# Patient Record
Sex: Male | Born: 1990
Health system: Southern US, Community
[De-identification: ages and names within clinical notes are randomized; demographics above are authoritative.]

## PROBLEM LIST (undated history)

## (undated) DIAGNOSIS — M041 Periodic fever syndromes: Secondary | ICD-10-CM

## (undated) DIAGNOSIS — M0890 Juvenile arthritis, unspecified, unspecified site: Secondary | ICD-10-CM

## (undated) DIAGNOSIS — J45909 Unspecified asthma, uncomplicated: Secondary | ICD-10-CM

## (undated) HISTORY — PX: EXTERNAL EAR SURGERY: SHX627

## (undated) HISTORY — PX: INNER EAR SURGERY: SHX679

## (undated) HISTORY — DX: Periodic fever syndromes: M04.1

## (undated) HISTORY — PX: WISDOM TOOTH EXTRACTION: SHX21

---
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
## 2010-03-02 ENCOUNTER — Emergency Department (HOSPITAL_COMMUNITY): Admission: EM | Admit: 2010-03-02 | Discharge: 2010-03-02 | Payer: Self-pay | Admitting: Emergency Medicine

## 2010-03-28 ENCOUNTER — Emergency Department (HOSPITAL_COMMUNITY): Admission: EM | Admit: 2010-03-28 | Discharge: 2010-03-07 | Payer: Self-pay | Admitting: Emergency Medicine

## 2010-04-19 ENCOUNTER — Emergency Department (HOSPITAL_COMMUNITY)
Admission: EM | Admit: 2010-04-19 | Discharge: 2010-04-19 | Payer: Self-pay | Source: Home / Self Care | Admitting: Emergency Medicine

## 2010-04-23 ENCOUNTER — Emergency Department (HOSPITAL_COMMUNITY)
Admission: EM | Admit: 2010-04-23 | Discharge: 2010-04-23 | Payer: Self-pay | Source: Home / Self Care | Admitting: Emergency Medicine

## 2010-05-12 ENCOUNTER — Encounter: Payer: Self-pay | Admitting: Pediatrics

## 2010-09-06 NOTE — Procedures (Signed)
EEG NUMBER:  [id number].   PERFORMED AND DICTATED:  July 10, 2005.   CLINICAL HISTORY:  The patient is a 20 year old male with periodic episodes  of fever with temperatures 101-103. He complains of frontal pressure-like  headaches and has episodes of memory loss. Study is being done to look for  the presence of a seizure disorder.   PROCEDURE:  The tracing is carried out on a 32-channel digital Cadwell  recorder reformatted into 16-channel montages with one devoted to EKG. The  patient was awake and asleep during the recording. The [employer] 10/20  system lead placement was used.   DESCRIPTION OF FINDINGS:  Dominant frequency is an 8-10 Hz 20-25 microvolt  activity that is well regulated and attenuates partially with eye opening.   Background activity is a mixture of mixed frequency theta and frontally  predominant beta range activity.   The patient becomes drowsy with mixed frequency theta and delta range  components and loss of the dominant frequency. The patient drifts briefly  into natural sleep with vertex sharp waves and fragmentary sleep spindles  and it is desynchronized delta background.   The patient is aroused and the waking record returns. Intermittent photic  stimulation induced a sustained symmetric driving response. Hyperventilation  caused little change. There was no interictal epileptiform activity in the  form of spikes or sharp waves.   EKG showed a regular sinus rhythm with ventricular response of 72 beats per  minute.   IMPRESSION:  Normal record awake and asleep.      Deanna Artis. Sharene Skeans, M.D.  Electronically Signed     ZOX:[id number]  D:  07/10/2005 15:57:33  T:  07/11/2005 16:53:52  Job #:  [id number]   cc:   Jefferey Pica, MD

## 2011-01-22 ENCOUNTER — Emergency Department (HOSPITAL_COMMUNITY)
Admission: EM | Admit: 2011-01-22 | Discharge: 2011-01-23 | Disposition: A | Discharge status: Home / Self Care | Payer: Medicaid Other | Attending: Emergency Medicine | Admitting: Emergency Medicine

## 2011-01-22 DIAGNOSIS — R10819 Abdominal tenderness, unspecified site: Secondary | ICD-10-CM | POA: Insufficient documentation

## 2011-01-22 DIAGNOSIS — R51 Headache: Secondary | ICD-10-CM | POA: Insufficient documentation

## 2011-05-08 ENCOUNTER — Emergency Department (HOSPITAL_COMMUNITY)
Admission: EM | Admit: 2011-05-08 | Discharge: 2011-05-08 | Disposition: A | Discharge status: Home / Self Care | Payer: Medicaid Other | Attending: Emergency Medicine | Admitting: Emergency Medicine

## 2011-05-08 ENCOUNTER — Encounter (HOSPITAL_COMMUNITY): Payer: Self-pay | Admitting: *Deleted

## 2011-05-08 DIAGNOSIS — R5383 Other fatigue: Secondary | ICD-10-CM | POA: Insufficient documentation

## 2011-05-08 DIAGNOSIS — R000 Tachycardia, unspecified: Secondary | ICD-10-CM | POA: Insufficient documentation

## 2011-05-08 DIAGNOSIS — E876 Hypokalemia: Secondary | ICD-10-CM | POA: Insufficient documentation

## 2011-05-08 DIAGNOSIS — R5381 Other malaise: Secondary | ICD-10-CM | POA: Insufficient documentation

## 2011-05-08 DIAGNOSIS — K529 Noninfective gastroenteritis and colitis, unspecified: Secondary | ICD-10-CM

## 2011-05-08 DIAGNOSIS — R509 Fever, unspecified: Secondary | ICD-10-CM | POA: Insufficient documentation

## 2011-05-08 DIAGNOSIS — K5289 Other specified noninfective gastroenteritis and colitis: Secondary | ICD-10-CM | POA: Insufficient documentation

## 2011-05-08 HISTORY — DX: Periodic fever syndromes: M04.1

## 2011-05-08 HISTORY — DX: Juvenile arthritis, unspecified, unspecified site: M08.90

## 2011-05-08 LAB — COMPREHENSIVE METABOLIC PANEL
Albumin: 3.8 g/dL (ref 3.5–5.2)
BUN: 9 mg/dL (ref 6–23)
Creatinine, Ser: 0.81 mg/dL (ref 0.50–1.35)
GFR calc Af Amer: >90 mL/min (ref >90)
Glucose, Bld: 108 mg/dL — ABNORMAL HIGH (ref 70–99)
Total Bilirubin: 1.3 mg/dL — ABNORMAL HIGH (ref 0.3–1.2)
Total Protein: 8 g/dL (ref 6.0–8.3)

## 2011-05-08 LAB — CBC
HCT: 40.8 % (ref 39.0–52.0)
MCV: 79.8 fL (ref 78.0–100.0)
Platelets: 172 10*3/uL (ref 150–400)
RBC: 5.11 MIL/uL (ref 4.22–5.81)
RDW: 15 % (ref 11.5–15.5)
WBC: 8.3 10*3/uL (ref 4.0–10.5)

## 2011-05-08 LAB — DIFFERENTIAL
Basophils Absolute: 0 10*3/uL (ref 0.0–0.1)
Basophils Relative: 0 % (ref 0–1)
Eosinophils Absolute: 0 10*3/uL (ref 0.0–0.7)
Eosinophils Relative: 0 % (ref 0–5)
Lymphocytes Relative: 6 % — ABNORMAL LOW (ref 12–46)
Lymphs Abs: 0.5 10*3/uL — ABNORMAL LOW (ref 0.7–4.0)
Monocytes Absolute: 0.5 10*3/uL (ref 0.1–1.0)
Monocytes Relative: 6 % (ref 3–12)
Neutro Abs: 7.3 10*3/uL (ref 1.7–7.7)
Neutrophils Relative %: 88 % — ABNORMAL HIGH (ref 43–77)

## 2011-05-08 MED ORDER — SODIUM CHLORIDE 0.9 % IV BOLUS (SEPSIS)
1000.0000 mL | Freq: Once | INTRAVENOUS | Status: AC
  Administered 2011-05-08: 1000 mL via INTRAVENOUS

## 2011-05-08 MED ORDER — POTASSIUM CHLORIDE CRYS ER 20 MEQ PO TBCR
40.0000 meq | EXTENDED_RELEASE_TABLET | Freq: Once | ORAL | Status: AC
  Administered 2011-05-08: 40 meq via ORAL
  Filled 2011-05-08: qty 2

## 2011-05-08 MED ORDER — LOPERAMIDE HCL 2 MG PO CAPS: 2.0000 mg | ORAL_CAPSULE | Freq: Four times a day (QID) | ORAL | Status: AC | PRN

## 2011-05-08 MED ORDER — KETOROLAC TROMETHAMINE 30 MG/ML IJ SOLN
30.0000 mg | Freq: Once | INTRAMUSCULAR | Status: AC
  Administered 2011-05-08: 30 mg via INTRAVENOUS
  Filled 2011-05-08: qty 1

## 2011-05-08 MED ORDER — PROMETHAZINE HCL 25 MG PO TABS: 25.0000 mg | ORAL_TABLET | Freq: Four times a day (QID) | ORAL | Status: AC | PRN

## 2011-05-08 MED ORDER — ONDANSETRON HCL 4 MG/2ML IJ SOLN
4.0000 mg | Freq: Once | INTRAMUSCULAR | Status: AC
  Administered 2011-05-08: 4 mg via INTRAVENOUS
  Filled 2011-05-08: qty 2

## 2011-05-08 NOTE — ED Notes (Signed)
Pt states "I've been tx'd everywhere, have HID, started having bloody diarrhea & vomiting blood yesterday"

## 2011-05-08 NOTE — ED Provider Notes (Signed)
History     CSN: [id number]  Arrival date & time 05/08/11  1311   First MD Initiated Contact with Patient 05/08/11 1506      Chief Complaint  Patient presents with  . Diarrhea    bloody - Hx of HID    (Consider location/radiation/quality/duration/timing/severity/associated sxs/prior treatment) Patient is a 21 y.o. male presenting with diarrhea. The history is provided by the patient.  Diarrhea The primary symptoms include fever, abdominal pain, nausea, vomiting and diarrhea. Primary symptoms do not include rash.  The illness does not include chills.   the   patient is a 21 year old male, with a history of periodic, fever.  Syndrome, and HIDS , for which he takes steroids, and Diclofenac.  He is followed for his disease at [facility].  He complains of nausea, vomiting, diarrhea, and generalized abdominal pain since yesterday.  He has felt like he said a fever, as well.  He says his infant son had similar symptoms about 2 weeks ago.  He has not been taking antibiotics recently.  He states that there is blood in his emesis and stool.  He denies respiratory symptoms.  He says usually when this occurs.  He comes in the ER and gets Flumadine and is released.  Past Medical History  Diagnosis Date  . Periodic fever syndrome   . Juvenile arthritis     History reviewed. No pertinent past surgical history.  No family history on file.  History  Substance Use Topics  . Smoking status: Former Games developer  . Smokeless tobacco: Not on file  . Alcohol Use: No      Review of Systems  Constitutional: Positive for fever. Negative for chills.  Respiratory: Negative for cough and shortness of breath.   Gastrointestinal: Positive for nausea, vomiting, abdominal pain, diarrhea and blood in stool.  Skin: Negative for rash.  Neurological: Positive for weakness. Negative for headaches.  All other systems reviewed and are negative.    Allergies  Codeine  Home Medications   Current  Outpatient Rx  Name Route Sig Dispense Refill  . ACETAMINOPHEN 500 MG PO TABS Oral Take 1,000 mg by mouth every 6 (six) hours as needed. For pain    . DICLOFENAC SODIUM 75 MG PO TBEC Oral Take 75 mg by mouth 2 (two) times daily.    . IBUPROFEN 800 MG PO TABS Oral Take 800 mg by mouth every 8 (eight) hours as needed. For pain      BP 148/135  Pulse 103  Temp(Src) 99.5 F (37.5 C) (Oral)  Resp 19  Wt 220 lb (99.791 kg)  SpO2 100%  Physical Exam  Vitals reviewed. Constitutional: He is oriented to person, place, and time. He appears well-developed and well-nourished. No distress.  HENT:  Head: Normocephalic and atraumatic.  Eyes: EOM are normal. Pupils are equal, round, and reactive to light.  Neck: Normal range of motion. Neck supple.  Cardiovascular: Regular rhythm and normal heart sounds.   No murmur heard.      Tachycardia  Pulmonary/Chest: Effort normal and breath sounds normal. No respiratory distress. He has no wheezes. He has no rales.  Abdominal: Soft. Bowel sounds are normal. He exhibits no distension and no mass. There is no tenderness. There is no rebound and no guarding.  Musculoskeletal: Normal range of motion. He exhibits no edema and no tenderness.  Neurological: He is alert and oriented to person, place, and time. No cranial nerve deficit.  Skin: Skin is warm and dry. He is not diaphoretic.  Psychiatric: He has a normal mood and affect. His behavior is normal.    ED Course  Procedures (including critical care time) 21 year old, male, complains of symptoms consistent with gastroenteritis.  He is mildly tachycardic.  Helistat was diabetes basic laboratory testing and treat his symptoms.  He does not have an acute abdomen likely.  He will be able to go home.  After treatment   Labs Reviewed  COMPREHENSIVE METABOLIC PANEL  CBC  DIFFERENTIAL   No results found.   No diagnosis found.  Feels better. Eating. No vomiting in ed.  MDM  Gastroenteritis - asx after  ed tx Hypokalemia - k replaced in ed. N/v controlled.  So etio resolved       Nicholes Stairs, MD 05/08/11 [phone]

## 2012-01-04 ENCOUNTER — Emergency Department (HOSPITAL_COMMUNITY)
Admission: EM | Admit: 2012-01-04 | Discharge: 2012-01-04 | Disposition: A | Discharge status: Home / Self Care | Payer: Medicare Other | Attending: Emergency Medicine | Admitting: Emergency Medicine

## 2012-01-04 ENCOUNTER — Encounter (HOSPITAL_COMMUNITY): Payer: Self-pay | Admitting: Emergency Medicine

## 2012-01-04 ENCOUNTER — Ambulatory Visit: Payer: Medicaid Other

## 2012-01-04 DIAGNOSIS — K645 Perianal venous thrombosis: Secondary | ICD-10-CM | POA: Insufficient documentation

## 2012-01-04 DIAGNOSIS — Z87891 Personal history of nicotine dependence: Secondary | ICD-10-CM | POA: Insufficient documentation

## 2012-01-04 MED ORDER — HYDROCORTISONE 2.5 % RE CREA: TOPICAL_CREAM | RECTAL | Status: AC

## 2012-01-04 MED ORDER — ACETAMINOPHEN 325 MG PO TABS
650.0000 mg | ORAL_TABLET | Freq: Once | ORAL | Status: AC
  Administered 2012-01-04: 650 mg via ORAL
  Filled 2012-01-04: qty 2

## 2012-01-04 NOTE — ED Notes (Signed)
Patient c/o of a left sided rectal hemorrhoid. Patient states that he noticed it ruptured while he was playing with his daughter. There was some bloody drainage.  Patient states that drainage soak through about 10 baby wipes. Patient states that he felt immediate relief after the rupture.  Patient is no other distress.

## 2012-01-04 NOTE — ED Provider Notes (Signed)
History     CSN: [id number]  Arrival date & time 01/04/12  1544   First MD Initiated Contact with Patient 01/04/12 1624      Chief Complaint  Patient presents with  . Hemorrhoids    (Consider location/radiation/quality/duration/timing/severity/associated sxs/prior treatment) HPI  21 year old male presents for evaluations of rectal pain. Patient reports he notice a rectal hemorrhoid for the past several days.  Today he noticed that the hemorrhoid ruptured.  Patient felt immediate relief after the rupture. He noticed bloody drainage, soaked through 10 baby wipes. Not on any blood thinner.  Does admits to working a job where he sits often.  Denies any recent trauma.  Denies abd pain, fever, or rash.     Past Medical History  Diagnosis Date  . Periodic fever syndrome   . Juvenile arthritis     Past Surgical History  Procedure Date  . External ear surgery     No family history on file.  History  Substance Use Topics  . Smoking status: Former Smoker    Types: Cigarettes  . Smokeless tobacco: Not on file  . Alcohol Use: No      Review of Systems  Constitutional: Negative for fever.  Gastrointestinal: Negative for abdominal pain.  Hematological: Does not bruise/bleed easily.  All other systems reviewed and are negative.    Allergies  Codeine  Home Medications   Current Outpatient Rx  Name Route Sig Dispense Refill  . ACETAMINOPHEN 500 MG PO TABS Oral Take 1,000 mg by mouth every 6 (six) hours as needed. For pain      BP 133/72  Pulse 110  Resp 18  Ht 6' (1.829 m)  Wt 202 lb (91.627 kg)  BMI 27.40 kg/m2  SpO2 97%  Physical Exam  Nursing note and vitals reviewed. Constitutional: He appears well-developed and well-nourished. No distress.  HENT:  Head: Atraumatic.  Eyes: Conjunctivae normal are normal.  Neck: Normal range of motion. Neck supple.  Abdominal: There is no tenderness.  Genitourinary:       Rectum: thrombosed hemorrhoid, actively oozing,  ttp.  No perirectal abscess.  No anal fissure.  No fb.    ED Course  Procedures (including critical care time)  Labs Reviewed - No data to display No results found.   No diagnosis found.  1. Thrombosed hemorrhoid  MDM  Pt has thrombosed hemorrhoid with spontaneous rupture.  Not on any anticoagulant.  Will prescribe anusol and care instruction including sitz bath, using stool softener, and eat light diet.  Pt voice understanding and agrees with plan.    BP 133/72  Pulse 110  Temp 98.8 F (37.1 C) (Oral)  Resp 18  Ht 6' (1.829 m)  Wt 202 lb (91.627 kg)  BMI 27.40 kg/m2  SpO2 97%  Nursing notes reviewed and considered in documentation         Fayrene Helper, PA-C 01/04/12 1650

## 2012-01-04 NOTE — ED Provider Notes (Signed)
Medical screening examination/treatment/procedure(s) were performed by non-physician practitioner and as supervising physician I was immediately available for consultation/collaboration.  Alison Breeding, MD 01/04/12 2319 

## 2012-02-27 ENCOUNTER — Emergency Department (HOSPITAL_COMMUNITY)
Admission: EM | Admit: 2012-02-27 | Discharge: 2012-02-27 | Disposition: A | Discharge status: Home / Self Care | Payer: Medicare Other | Attending: Emergency Medicine | Admitting: Emergency Medicine

## 2012-02-27 ENCOUNTER — Encounter (HOSPITAL_COMMUNITY): Payer: Self-pay | Admitting: *Deleted

## 2012-02-27 DIAGNOSIS — M083 Juvenile rheumatoid polyarthritis (seronegative): Secondary | ICD-10-CM | POA: Insufficient documentation

## 2012-02-27 DIAGNOSIS — Z862 Personal history of diseases of the blood and blood-forming organs and certain disorders involving the immune mechanism: Secondary | ICD-10-CM | POA: Insufficient documentation

## 2012-02-27 DIAGNOSIS — R05 Cough: Secondary | ICD-10-CM | POA: Insufficient documentation

## 2012-02-27 DIAGNOSIS — Z87891 Personal history of nicotine dependence: Secondary | ICD-10-CM | POA: Insufficient documentation

## 2012-02-27 DIAGNOSIS — J45909 Unspecified asthma, uncomplicated: Secondary | ICD-10-CM | POA: Insufficient documentation

## 2012-02-27 DIAGNOSIS — R059 Cough, unspecified: Secondary | ICD-10-CM | POA: Insufficient documentation

## 2012-02-27 DIAGNOSIS — Z8639 Personal history of other endocrine, nutritional and metabolic disease: Secondary | ICD-10-CM | POA: Insufficient documentation

## 2012-02-27 MED ORDER — ALBUTEROL SULFATE (5 MG/ML) 0.5% IN NEBU
5.0000 mg | INHALATION_SOLUTION | Freq: Once | RESPIRATORY_TRACT | Status: AC
  Administered 2012-02-27: 5 mg via RESPIRATORY_TRACT
  Filled 2012-02-27: qty 1

## 2012-02-27 MED ORDER — ALBUTEROL SULFATE HFA 108 (90 BASE) MCG/ACT IN AERS
2.0000 | INHALATION_SPRAY | RESPIRATORY_TRACT | Status: DC | PRN
  Administered 2012-02-27: 2 via RESPIRATORY_TRACT
  Filled 2012-02-27: qty 6.7

## 2012-02-27 MED ORDER — IPRATROPIUM BROMIDE 0.02 % IN SOLN
0.5000 mg | Freq: Once | RESPIRATORY_TRACT | Status: AC
  Administered 2012-02-27: 0.5 mg via RESPIRATORY_TRACT
  Filled 2012-02-27: qty 2.5

## 2012-02-27 NOTE — ED Provider Notes (Signed)
Medical screening examination/treatment/procedure(s) were performed by non-physician practitioner and as supervising physician I was immediately available for consultation/collaboration.   Tyrik Stetzer H Cathe Bilger, MD 02/27/12 1558 

## 2012-02-27 NOTE — ED Notes (Signed)
Pt reports SOB and non-productive cough this am.  Pt reports hx of asthma.  Pt also requesting albuterol rx.  Pt reports chest pain when coughing.

## 2012-02-27 NOTE — ED Provider Notes (Signed)
History     CSN: [id number]  Arrival date & time 02/27/12  1019   First MD Initiated Contact with Patient 02/27/12 1024      Chief Complaint  Patient presents with  . Shortness of Breath    (Consider location/radiation/quality/duration/timing/severity/associated sxs/prior treatment) HPI Comments: Patient with history of asthma presents after having an asthma attack this morning. Patient states that he felt short of breath for approximately 90 minutes until improvement spontaneously. Patient states that he is out of his rescue inhaler. He denies upper respiratory tract infection symptoms. Patient had coughing with asthma attack. No fevers. No other treatments. Onset acute. Course is gradually improving. Nothing makes symptoms better worse.  The history is provided by the patient.    Past Medical History  Diagnosis Date  . Periodic fever syndrome   . Juvenile arthritis     Past Surgical History  Procedure Date  . External ear surgery     No family history on file.  History  Substance Use Topics  . Smoking status: Former Smoker    Types: Cigarettes  . Smokeless tobacco: Not on file  . Alcohol Use: No      Review of Systems  Constitutional: Negative for fever.  HENT: Negative for sore throat and rhinorrhea.   Eyes: Negative for redness.  Respiratory: Positive for cough, chest tightness, shortness of breath and wheezing.   Cardiovascular: Negative for chest pain.  Gastrointestinal: Negative for nausea, vomiting, abdominal pain and diarrhea.  Genitourinary: Negative for dysuria.  Musculoskeletal: Negative for myalgias.  Skin: Negative for rash.  Neurological: Negative for headaches.    Allergies  Codeine  Home Medications   Current Outpatient Rx  Name  Route  Sig  Dispense  Refill  . ACETAMINOPHEN 500 MG PO TABS   Oral   Take by mouth every 6 (six) hours as needed. For pain         . ALBUTEROL SULFATE HFA 108 (90 BASE) MCG/ACT IN AERS   Inhalation  Inhale 2 puffs into the lungs every 6 (six) hours as needed. Breathing         . IBUPROFEN 200 MG PO TABS   Oral   Take 400 mg by mouth every 6 (six) hours as needed. Pain           BP 121/74  Pulse 64  Temp 97.8 F (36.6 C) (Oral)  Resp 20  SpO2 98%  Physical Exam  Nursing note and vitals reviewed. Constitutional: He appears well-developed and well-nourished.  HENT:  Head: Normocephalic and atraumatic.  Eyes: Conjunctivae normal are normal. Right eye exhibits no discharge. Left eye exhibits no discharge.  Neck: Normal range of motion. Neck supple.  Cardiovascular: Normal rate, regular rhythm and normal heart sounds.   Pulmonary/Chest: Effort normal. He has wheezes (expiratory, mild, all fields).  Abdominal: Soft. There is no tenderness.  Neurological: He is alert.  Skin: Skin is warm and dry.  Psychiatric: He has a normal mood and affect.    ED Course  Procedures (including critical care time)  Labs Reviewed - No data to display No results found.   1. Asthma     10:44 AM Patient seen and examined. Medications ordered.   Vital signs reviewed and are as follows: Filed Vitals:   02/27/12 1043  BP: 121/74  Pulse: 64  Temp: 97.8 F (36.6 C)  Resp: 20   12:24 PM Wheezing resolved after treatment. Will d/c to home with rescue inhaler.   Patient counseled on use of  albuterol HFA.  Told to use 1-2 puffs q 4 hours as needed for SOB.  Patient urged to return with worsening symptoms or other concerns. Patient verbalized understanding and agrees with plan.    MDM  Asthma attack -- improved in ED with treatment. Pt appears well before and after. Inhaler provided, urged pt to find PCP, referrals given.         Renne Crigler, [place] 02/27/12 1226

## 2012-07-12 ENCOUNTER — Emergency Department (HOSPITAL_COMMUNITY)
Admission: EM | Admit: 2012-07-12 | Discharge: 2012-07-12 | Disposition: A | Discharge status: Home / Self Care | Payer: Medicare Other | Attending: Emergency Medicine | Admitting: Emergency Medicine

## 2012-07-12 ENCOUNTER — Encounter (HOSPITAL_COMMUNITY): Payer: Self-pay | Admitting: *Deleted

## 2012-07-12 DIAGNOSIS — M19029 Primary osteoarthritis, unspecified elbow: Secondary | ICD-10-CM | POA: Insufficient documentation

## 2012-07-12 DIAGNOSIS — M1990 Unspecified osteoarthritis, unspecified site: Secondary | ICD-10-CM

## 2012-07-12 DIAGNOSIS — Z87891 Personal history of nicotine dependence: Secondary | ICD-10-CM | POA: Insufficient documentation

## 2012-07-12 DIAGNOSIS — Z79899 Other long term (current) drug therapy: Secondary | ICD-10-CM | POA: Insufficient documentation

## 2012-07-12 MED ORDER — IBUPROFEN 800 MG PO TABS: 800.0000 mg | ORAL_TABLET | Freq: Three times a day (TID) | ORAL | Status: DC

## 2012-07-12 MED ORDER — IBUPROFEN 800 MG PO TABS
800.0000 mg | ORAL_TABLET | Freq: Once | ORAL | Status: AC
  Administered 2012-07-12: 800 mg via ORAL
  Filled 2012-07-12: qty 1

## 2012-07-12 NOTE — ED Provider Notes (Signed)
Medical screening examination/treatment/procedure(s) were performed by non-physician practitioner and as supervising physician I was immediately available for consultation/collaboration.   Lyanne Co, MD 07/12/12 [phone]

## 2012-07-12 NOTE — ED Notes (Signed)
Pt reports hx of arthritis.  Reports waking up with pain in his R elbow this am.  States that he normally takes a bigger dose of motrin whenever his arthritis flares up.  No swelling noted at this time.

## 2012-07-12 NOTE — ED Provider Notes (Signed)
History     CSN: [id number]  Arrival date & time 07/12/12  1318   First MD Initiated Contact with Patient 07/12/12 1420      Chief Complaint  Patient presents with  . Joint Pain    R elbow    (Consider location/radiation/quality/duration/timing/severity/associated sxs/prior treatment) HPI Comments: This is a 22 year old male, past medical history remarkable for juvenile arthritis, who presents emergency department with chief complaint of right elbow pain. Patient states that this is not new to him. He states that this is how his arthritis reacts. He states that he normally takes high-dose ibuprofen when this happens. The ibuprofen will help the elbow pain. Patient is requesting ibuprofen 800 in the emergency department. The pain does not radiate. States pain is moderate.  The history is provided by the patient. No language interpreter was used.    Past Medical History  Diagnosis Date  . Periodic fever syndrome   . Juvenile arthritis     Past Surgical History  Procedure Laterality Date  . External ear surgery      No family history on file.  History  Substance Use Topics  . Smoking status: Former Smoker    Types: Cigarettes  . Smokeless tobacco: Not on file  . Alcohol Use: No      Review of Systems  All other systems reviewed and are negative.    Allergies  Codeine  Home Medications   Current Outpatient Rx  Name  Route  Sig  Dispense  Refill  . acetaminophen (TYLENOL) 500 MG tablet   Oral   Take by mouth every 6 (six) hours as needed. For pain         . albuterol (PROVENTIL HFA;VENTOLIN HFA) 108 (90 BASE) MCG/ACT inhaler   Inhalation   Inhale 2 puffs into the lungs every 6 (six) hours as needed. Breathing         . ibuprofen (ADVIL,MOTRIN) 200 MG tablet   Oral   Take 400 mg by mouth every 6 (six) hours as needed. Pain           BP 127/74  Pulse 63  Temp(Src) 98 F (36.7 C) (Oral)  Resp 18  SpO2 100%  Physical Exam  Nursing note and  vitals reviewed. Constitutional: He is oriented to person, place, and time. He appears well-developed and well-nourished.  HENT:  Head: Normocephalic and atraumatic.  Eyes: Conjunctivae and EOM are normal.  Neck: Normal range of motion.  Cardiovascular: Normal rate.   Pulmonary/Chest: Effort normal.  Abdominal: He exhibits no distension.  Musculoskeletal: Normal range of motion. He exhibits tenderness.  Soft tissue tenderness in the right elbow, no signs of infection, or cellulitis  Neurological: He is alert and oriented to person, place, and time.  Skin: Skin is dry.  Psychiatric: He has a normal mood and affect. His behavior is normal. Judgment and thought content normal.    ED Course  Procedures (including critical care time)  Labs Reviewed - No data to display No results found.   1. Arthritis       MDM  22 year old male with arthritis. Patient states that this is not new. He states that he gets elbow pain on and off, that it is easily treated with ibuprofen. He is requesting ibuprofen at this time. I will fill his ibuprofen, and have him followup with rheumatology. Patient understands and agrees with the plan. He is stable and ready for discharge.       Roxy Horseman, PA-C 07/12/12 1515

## 2012-07-31 ENCOUNTER — Emergency Department (HOSPITAL_COMMUNITY)
Admission: EM | Admit: 2012-07-31 | Discharge: 2012-08-01 | Disposition: A | Discharge status: Home / Self Care | Payer: Medicare Other | Attending: Emergency Medicine | Admitting: Emergency Medicine

## 2012-07-31 ENCOUNTER — Encounter (HOSPITAL_COMMUNITY): Payer: Self-pay | Admitting: *Deleted

## 2012-07-31 DIAGNOSIS — J45909 Unspecified asthma, uncomplicated: Secondary | ICD-10-CM | POA: Insufficient documentation

## 2012-07-31 DIAGNOSIS — R509 Fever, unspecified: Secondary | ICD-10-CM

## 2012-07-31 DIAGNOSIS — M7989 Other specified soft tissue disorders: Secondary | ICD-10-CM | POA: Insufficient documentation

## 2012-07-31 DIAGNOSIS — Z8739 Personal history of other diseases of the musculoskeletal system and connective tissue: Secondary | ICD-10-CM | POA: Insufficient documentation

## 2012-07-31 DIAGNOSIS — R51 Headache: Secondary | ICD-10-CM | POA: Insufficient documentation

## 2012-07-31 DIAGNOSIS — Z79899 Other long term (current) drug therapy: Secondary | ICD-10-CM | POA: Insufficient documentation

## 2012-07-31 DIAGNOSIS — R112 Nausea with vomiting, unspecified: Secondary | ICD-10-CM | POA: Insufficient documentation

## 2012-07-31 DIAGNOSIS — M791 Myalgia, unspecified site: Secondary | ICD-10-CM

## 2012-07-31 DIAGNOSIS — Z87891 Personal history of nicotine dependence: Secondary | ICD-10-CM | POA: Insufficient documentation

## 2012-07-31 DIAGNOSIS — R109 Unspecified abdominal pain: Secondary | ICD-10-CM | POA: Insufficient documentation

## 2012-07-31 DIAGNOSIS — IMO0001 Reserved for inherently not codable concepts without codable children: Secondary | ICD-10-CM | POA: Insufficient documentation

## 2012-07-31 HISTORY — DX: Unspecified asthma, uncomplicated: J45.909

## 2012-07-31 LAB — POCT I-STAT, CHEM 8
BUN: 9 mg/dL (ref 6–23)
Chloride: 97 mEq/L (ref 96–112)
Creatinine, Ser: 1.1 mg/dL (ref 0.50–1.35)
Sodium: 140 mEq/L (ref 135–145)
TCO2: 33 mmol/L (ref 0–100)

## 2012-07-31 MED ORDER — ONDANSETRON HCL 4 MG/2ML IJ SOLN
4.0000 mg | Freq: Once | INTRAMUSCULAR | Status: AC
  Administered 2012-07-31: 4 mg via INTRAVENOUS
  Filled 2012-07-31: qty 2

## 2012-07-31 MED ORDER — SODIUM CHLORIDE 0.9 % IV BOLUS (SEPSIS)
1000.0000 mL | Freq: Once | INTRAVENOUS | Status: AC
  Administered 2012-07-31: 1000 mL via INTRAVENOUS

## 2012-07-31 MED ORDER — MORPHINE SULFATE 4 MG/ML IJ SOLN
4.0000 mg | Freq: Once | INTRAMUSCULAR | Status: AC
  Administered 2012-07-31: 4 mg via INTRAVENOUS
  Filled 2012-07-31: qty 1

## 2012-07-31 NOTE — ED Notes (Signed)
Pt states he has had generalized body aches and pains with N/V/D since Monday, April 7.  Pt has HIDS, periodic fever syndrome.  Pt has not seen a doctor regarding this episode and does not currently have a PCP.

## 2012-07-31 NOTE — ED Provider Notes (Signed)
History     CSN: [id number]  Arrival date & time 07/31/12  2119   First MD Initiated Contact with Patient 07/31/12 2205      Chief Complaint  Patient presents with  . Emesis  . Generalized Body Aches  . Diarrhea    (Consider location/radiation/quality/duration/timing/severity/associated sxs/prior treatment) HPI Comments: Patient with Hx  JRA and HIDS ( Hyperimmunoglobulin D Syndrome) now with generalized body pain for 6 days   Patient is a 22 y.o. male presenting with vomiting and diarrhea. The history is provided by the patient.  Emesis Severity:  Moderate Duration:  6 days Timing:  Constant Quality:  Bilious material Able to tolerate:  Liquids How soon after eating does vomiting occur:  30 minutes Progression:  Worsening Chronicity:  Recurrent Recent urination:  Normal Relieved by:  Nothing Ineffective treatments:  None tried Associated symptoms: abdominal pain, arthralgias, chills, diarrhea, fever, headaches and myalgias   Diarrhea Associated symptoms: abdominal pain, arthralgias, chills, fever, headaches, myalgias and vomiting     Past Medical History  Diagnosis Date  . Periodic fever syndrome   . Juvenile arthritis   . Asthma     Past Surgical History  Procedure Laterality Date  . External ear surgery      No family history on file.  History  Substance Use Topics  . Smoking status: Former Smoker    Types: Cigarettes  . Smokeless tobacco: Not on file  . Alcohol Use: No      Review of Systems  Constitutional: Positive for fever and chills.  Gastrointestinal: Positive for vomiting, abdominal pain and diarrhea.  Musculoskeletal: Positive for myalgias and arthralgias.  Neurological: Positive for headaches.    Allergies  Codeine  Home Medications   Current Outpatient Rx  Name  Route  Sig  Dispense  Refill  . acetaminophen (TYLENOL) 500 MG tablet   Oral   Take by mouth every 6 (six) hours as needed. For pain         . diclofenac  (VOLTAREN) 75 MG EC tablet   Oral   Take 75 mg by mouth 2 (two) times daily.         Marland Kitchen ibuprofen (ADVIL,MOTRIN) 200 MG tablet   Oral   Take 400 mg by mouth every 6 (six) hours as needed. Pain         . albuterol (PROVENTIL HFA;VENTOLIN HFA) 108 (90 BASE) MCG/ACT inhaler   Inhalation   Inhale 2 puffs into the lungs every 6 (six) hours as needed. Breathing           BP 114/64  Pulse 76  Temp(Src) 98.8 F (37.1 C) (Oral)  Resp 18  SpO2 100%  Physical Exam  Constitutional: He is oriented to person, place, and time. He appears well-developed and well-nourished.  Eyes: Pupils are equal, round, and reactive to light.  Neck: Normal range of motion.  Cardiovascular: Normal rate and regular rhythm.   Abdominal: Soft. Bowel sounds are normal. He exhibits no distension. There is no tenderness.  Musculoskeletal: Normal range of motion. He exhibits tenderness. He exhibits no edema.  Neurological: He is alert and oriented to person, place, and time.  Skin: Skin is warm.    ED Course  Procedures (including critical care time)  Labs Reviewed  POCT I-STAT, CHEM 8 - Abnormal; Notable for the following:    Glucose, Bld 100 (*)    All other components within normal limits   No results found.   1. Myalgia   2. Fever  3. Nausea, vomiting and diarrhea       MDM   Patient received 1 L fluid, IV, Toradol, and is feeling, at his baseline        Arman Filter, NP 08/01/12 4098  Arman Filter, NP 08/01/12 1191  Arman Filter, NP 08/01/12 [phone]

## 2012-08-01 NOTE — ED Provider Notes (Signed)
Medical screening examination/treatment/procedure(s) were performed by non-physician practitioner and as supervising physician I was immediately available for consultation/collaboration.  Ethelda Chick, MD 08/01/12 [phone]

## 2012-08-16 ENCOUNTER — Encounter (HOSPITAL_COMMUNITY): Payer: Self-pay | Admitting: Emergency Medicine

## 2012-08-16 ENCOUNTER — Emergency Department (HOSPITAL_COMMUNITY)
Admission: EM | Admit: 2012-08-16 | Discharge: 2012-08-16 | Disposition: A | Discharge status: Home / Self Care | Payer: Medicare Other | Attending: Emergency Medicine | Admitting: Emergency Medicine

## 2012-08-16 ENCOUNTER — Emergency Department (HOSPITAL_COMMUNITY): Payer: Medicare Other

## 2012-08-16 DIAGNOSIS — Z79899 Other long term (current) drug therapy: Secondary | ICD-10-CM | POA: Insufficient documentation

## 2012-08-16 DIAGNOSIS — Z87891 Personal history of nicotine dependence: Secondary | ICD-10-CM | POA: Insufficient documentation

## 2012-08-16 DIAGNOSIS — Y9239 Other specified sports and athletic area as the place of occurrence of the external cause: Secondary | ICD-10-CM | POA: Insufficient documentation

## 2012-08-16 DIAGNOSIS — S82832A Other fracture of upper and lower end of left fibula, initial encounter for closed fracture: Secondary | ICD-10-CM

## 2012-08-16 DIAGNOSIS — J45909 Unspecified asthma, uncomplicated: Secondary | ICD-10-CM | POA: Insufficient documentation

## 2012-08-16 DIAGNOSIS — X500XXA Overexertion from strenuous movement or load, initial encounter: Secondary | ICD-10-CM | POA: Insufficient documentation

## 2012-08-16 DIAGNOSIS — Z8639 Personal history of other endocrine, nutritional and metabolic disease: Secondary | ICD-10-CM | POA: Insufficient documentation

## 2012-08-16 DIAGNOSIS — Y9389 Activity, other specified: Secondary | ICD-10-CM | POA: Insufficient documentation

## 2012-08-16 DIAGNOSIS — Z862 Personal history of diseases of the blood and blood-forming organs and certain disorders involving the immune mechanism: Secondary | ICD-10-CM | POA: Insufficient documentation

## 2012-08-16 DIAGNOSIS — S82899A Other fracture of unspecified lower leg, initial encounter for closed fracture: Secondary | ICD-10-CM | POA: Insufficient documentation

## 2012-08-16 DIAGNOSIS — Z8739 Personal history of other diseases of the musculoskeletal system and connective tissue: Secondary | ICD-10-CM | POA: Insufficient documentation

## 2012-08-16 MED ORDER — TRAMADOL HCL 50 MG PO TABS: 50.0000 mg | ORAL_TABLET | Freq: Four times a day (QID) | ORAL | Status: DC | PRN

## 2012-08-16 MED ORDER — TRAMADOL HCL 50 MG PO TABS
50.0000 mg | ORAL_TABLET | Freq: Once | ORAL | Status: AC
  Administered 2012-08-16: 50 mg via ORAL
  Filled 2012-08-16: qty 1

## 2012-08-16 NOTE — ED Notes (Signed)
Patient twisted left ankle while playing ball yesterday.  Was not seen yesterday, swelling to the area and pain.

## 2012-08-16 NOTE — ED Provider Notes (Signed)
History     CSN: [id number]  Arrival date & time 08/16/12  1919   First MD Initiated Contact with Patient 08/16/12 1946      Chief Complaint  Patient presents with  . Ankle Injury    (Consider location/radiation/quality/duration/timing/severity/associated sxs/prior treatment) HPI Comments: Patient is a 22 year old male who presents with left ankle pain since yesterday. The mechanism of injury was sudden ankle inversion. Patient reports hearing a "pop" sudden onset of throbbing, severe pain that is localized to left ankle. Patient reports progressive worsening of pain. Ankle movement and weight bearing activity make the pain worse. Nothing makes the pain better. Patient reports associated swelling. Patient has not tried anything for pain relief. Patient denies obvious deformity, numbness/tingling, coolness/weakness of extremity, bruising, and any other injury.     Patient is a 22 y.o. male presenting with lower extremity injury.  Ankle Injury Associated symptoms include arthralgias and joint swelling.    Past Medical History  Diagnosis Date  . Periodic fever syndrome   . Juvenile arthritis   . Asthma     Past Surgical History  Procedure Laterality Date  . External ear surgery    . Inner ear surgery      No family history on file.  History  Substance Use Topics  . Smoking status: Former Smoker    Types: Cigarettes  . Smokeless tobacco: Not on file  . Alcohol Use: No      Review of Systems  Musculoskeletal: Positive for joint swelling and arthralgias.  All other systems reviewed and are negative.    Allergies  Codeine  Home Medications   Current Outpatient Rx  Name  Route  Sig  Dispense  Refill  . acetaminophen (TYLENOL) 500 MG tablet   Oral   Take 500 mg by mouth every 6 (six) hours as needed for pain. For pain         . albuterol (PROVENTIL HFA;VENTOLIN HFA) 108 (90 BASE) MCG/ACT inhaler   Inhalation   Inhale 2 puffs into the lungs every 6 (six)  hours as needed. Breathing         . diclofenac (VOLTAREN) 75 MG EC tablet   Oral   Take 75 mg by mouth 2 (two) times daily.           BP 124/66  Pulse 93  Temp(Src) 98.1 F (36.7 C) (Oral)  Resp 20  Wt 220 lb (99.791 kg)  BMI 29.83 kg/m2  SpO2 95%  Physical Exam  Nursing note and vitals reviewed. Constitutional: He appears well-developed and well-nourished. No distress.  HENT:  Head: Normocephalic and atraumatic.  Eyes: Conjunctivae are normal.  Neck: Normal range of motion.  Cardiovascular: Normal rate and regular rhythm.  Exam reveals no gallop and no friction rub.   No murmur heard. Pulmonary/Chest: Effort normal and breath sounds normal. He has no wheezes. He has no rales. He exhibits no tenderness.  Abdominal: Soft. There is no tenderness.  Musculoskeletal: Normal range of motion.  Left ankle ROM limited due to pain and swelling. Generalized tenderness to palpation. No obvious deformity.   Neurological: He is alert. Coordination normal.  Sensation intact. Speech is goal-oriented. Moves limbs without ataxia.   Skin: Skin is warm and dry.  Psychiatric: He has a normal mood and affect. His behavior is normal.    ED Course  Procedures (including critical care time)  Labs Reviewed - No data to display Dg Ankle Complete Left  08/16/2012  *RADIOLOGY REPORT*  Clinical Data: Basketball  injury.  Pain and swelling.  LEFT ANKLE COMPLETE - 3+ VIEW  Comparison: None.  Findings: There is lateral soft tissue swelling.  There is a tiny avulsion fracture from the tip of the fibula.  No other finding.  IMPRESSION: Lateral ligamentous injury with a tiny avulsion fracture of the tip of the fibula and lateral soft tissue swelling.   Original Report Authenticated By: Paulina Fusi, M.D.      1. Closed avulsion fracture of distal fibula, left, initial encounter       MDM  8:10 PM Patient will have ibuprofen for pain. Xray shows tiny avulsion fracture of tip of fibula. Patient will  have crutches and ankle splint and follow up with Ortho. Patient will have tramadol for pain. No further evaluation needed at this time. No neurovascular compromise.         Emilia Beck, [place] 08/16/12 2054

## 2012-08-17 NOTE — ED Provider Notes (Signed)
Medical screening examination/treatment/procedure(s) were performed by non-physician practitioner and as supervising physician I was immediately available for consultation/collaboration.  Raeford Razor, MD 08/17/12 [phone]

## 2012-12-21 ENCOUNTER — Emergency Department (HOSPITAL_COMMUNITY)
Admission: EM | Admit: 2012-12-21 | Discharge: 2012-12-22 | Disposition: A | Discharge status: Home / Self Care | Payer: Medicare Other | Attending: Emergency Medicine | Admitting: Emergency Medicine

## 2012-12-21 ENCOUNTER — Encounter (HOSPITAL_COMMUNITY): Payer: Self-pay

## 2012-12-21 DIAGNOSIS — Z87891 Personal history of nicotine dependence: Secondary | ICD-10-CM | POA: Insufficient documentation

## 2012-12-21 DIAGNOSIS — Z79899 Other long term (current) drug therapy: Secondary | ICD-10-CM | POA: Insufficient documentation

## 2012-12-21 DIAGNOSIS — IMO0002 Reserved for concepts with insufficient information to code with codable children: Secondary | ICD-10-CM | POA: Insufficient documentation

## 2012-12-21 DIAGNOSIS — J3489 Other specified disorders of nose and nasal sinuses: Secondary | ICD-10-CM | POA: Insufficient documentation

## 2012-12-21 DIAGNOSIS — Z8639 Personal history of other endocrine, nutritional and metabolic disease: Secondary | ICD-10-CM | POA: Insufficient documentation

## 2012-12-21 DIAGNOSIS — R509 Fever, unspecified: Secondary | ICD-10-CM | POA: Insufficient documentation

## 2012-12-21 DIAGNOSIS — J45909 Unspecified asthma, uncomplicated: Secondary | ICD-10-CM | POA: Insufficient documentation

## 2012-12-21 DIAGNOSIS — J029 Acute pharyngitis, unspecified: Secondary | ICD-10-CM | POA: Insufficient documentation

## 2012-12-21 DIAGNOSIS — Z862 Personal history of diseases of the blood and blood-forming organs and certain disorders involving the immune mechanism: Secondary | ICD-10-CM | POA: Insufficient documentation

## 2012-12-21 LAB — RAPID STREP SCREEN (MED CTR MEBANE ONLY): Streptococcus, Group A Screen (Direct): NEGATIVE

## 2012-12-21 MED ORDER — ALBUTEROL SULFATE HFA 108 (90 BASE) MCG/ACT IN AERS
2.0000 | INHALATION_SPRAY | RESPIRATORY_TRACT | Status: DC | PRN
  Administered 2012-12-21: 2 via RESPIRATORY_TRACT
  Filled 2012-12-21: qty 6.7

## 2012-12-21 NOTE — ED Provider Notes (Signed)
CSN: [id number]     Arrival date & time 12/21/12  1902 History  This chart was scribed for non-physician practitioner, Earley Favor, FNP working with Juliet Rude. Rubin Payor, MD by Greggory Stallion, ED scribe. This patient was seen in room WTR8/WTR8 and the patient's care was started at 9:33 PM.   Chief Complaint  Patient presents with  . Nasal Congestion  . Sore Throat   The history is provided by the patient. No language interpreter was used.    HPI Comments: Rickey Becker is a 22 y.o. male with h/o asthma who presents to the Emergency Department complaining of sore throat and sinus congestion onset 6 days ago. Pt has also been coughing for 2 weeks. He states he had a fever of 102 at home a few days ago but it is resolved now. Pt has tried Tylenol with no relief. He has also used Benadryl that only helped for about one hour. He states a lot of people have been sick at work. Pt denies rhinorrhea and post nasal drip currently.   Past Medical History  Diagnosis Date  . Periodic fever syndrome   . Juvenile arthritis   . Asthma    Past Surgical History  Procedure Laterality Date  . External ear surgery    . Inner ear surgery     No family history on file. History  Substance Use Topics  . Smoking status: Former Smoker    Types: Cigarettes  . Smokeless tobacco: Not on file  . Alcohol Use: No    Review of Systems  Constitutional: Negative for fever.  HENT: Positive for congestion and sore throat. Negative for rhinorrhea and postnasal drip.   Respiratory: Positive for cough.   All other systems reviewed and are negative.    Allergies  Codeine  Home Medications   Current Outpatient Rx  Name  Route  Sig  Dispense  Refill  . albuterol (PROVENTIL HFA;VENTOLIN HFA) 108 (90 BASE) MCG/ACT inhaler   Inhalation   Inhale 2 puffs into the lungs every 6 (six) hours as needed. Breathing         . predniSONE (DELTASONE) 10 MG tablet   Oral   Take 2 tablets (20 mg total) by mouth daily.   15 tablet   0    BP 122/83  Pulse 106  Temp(Src) 98.6 F (37 C) (Oral)  Resp 18  SpO2 96%  Physical Exam  Nursing note and vitals reviewed. Constitutional: He is oriented to person, place, and time. He appears well-developed and well-nourished. No distress.  HENT:  Head: Normocephalic and atraumatic.  Right Ear: Tympanic membrane and external ear normal.  Left Ear: Tympanic membrane and external ear normal.  No exudate. Uvula is swollen but midline.   Eyes: EOM are normal.  Neck: Neck supple. No tracheal deviation present.  Cardiovascular: Normal rate and regular rhythm.   Pulmonary/Chest: Effort normal and breath sounds normal. No respiratory distress. He has no wheezes. He has no rales.  Breath sounds equal bilaterally.   Musculoskeletal: Normal range of motion.  Neurological: He is alert and oriented to person, place, and time.  Skin: Skin is warm and dry.  Psychiatric: He has a normal mood and affect. His behavior is normal.    ED Course  Procedures (including critical care time)  DIAGNOSTIC STUDIES: Oxygen Saturation is 96% on RA, normal by my interpretation.    COORDINATION OF CARE: 9:55 PM-Discussed treatment plan which includes strep test and inhaler with pt at bedside and pt agreed  to plan.   Labs Review Labs Reviewed  RAPID STREP SCREEN  CULTURE, GROUP A STREP   Imaging Review No results found.  MDM   1. Asthma   2. Sore throat    I personally performed the services described in this documentation, which was scribed in my presence. The recorded information has been reviewed and is accurate.          Arman Filter, NP 12/22/12 [phone]

## 2012-12-21 NOTE — ED Notes (Signed)
Pt c/o sore throat and sinus congestion x6days

## 2012-12-22 MED ORDER — PREDNISONE 10 MG PO TABS: 20.0000 mg | ORAL_TABLET | Freq: Every day | ORAL | Status: DC

## 2012-12-22 NOTE — ED Provider Notes (Signed)
Medical screening examination/treatment/procedure(s) were performed by non-physician practitioner and as supervising physician I was immediately available for consultation/collaboration.  Martino Tompson R. Timmy Cleverly, MD 12/22/12 0041 

## 2012-12-24 LAB — CULTURE, GROUP A STREP

## 2013-02-03 ENCOUNTER — Emergency Department (HOSPITAL_COMMUNITY)
Admission: EM | Admit: 2013-02-03 | Discharge: 2013-02-03 | Disposition: A | Discharge status: Home / Self Care | Payer: Medicare Other | Attending: Emergency Medicine | Admitting: Emergency Medicine

## 2013-02-03 ENCOUNTER — Encounter (HOSPITAL_COMMUNITY): Payer: Self-pay | Admitting: Emergency Medicine

## 2013-02-03 DIAGNOSIS — Z862 Personal history of diseases of the blood and blood-forming organs and certain disorders involving the immune mechanism: Secondary | ICD-10-CM | POA: Insufficient documentation

## 2013-02-03 DIAGNOSIS — Z87891 Personal history of nicotine dependence: Secondary | ICD-10-CM | POA: Insufficient documentation

## 2013-02-03 DIAGNOSIS — R1110 Vomiting, unspecified: Secondary | ICD-10-CM

## 2013-02-03 DIAGNOSIS — R112 Nausea with vomiting, unspecified: Secondary | ICD-10-CM | POA: Insufficient documentation

## 2013-02-03 DIAGNOSIS — J45909 Unspecified asthma, uncomplicated: Secondary | ICD-10-CM | POA: Insufficient documentation

## 2013-02-03 DIAGNOSIS — Z8639 Personal history of other endocrine, nutritional and metabolic disease: Secondary | ICD-10-CM | POA: Insufficient documentation

## 2013-02-03 DIAGNOSIS — Z79899 Other long term (current) drug therapy: Secondary | ICD-10-CM | POA: Insufficient documentation

## 2013-02-03 DIAGNOSIS — M083 Juvenile rheumatoid polyarthritis (seronegative): Secondary | ICD-10-CM | POA: Insufficient documentation

## 2013-02-03 DIAGNOSIS — J029 Acute pharyngitis, unspecified: Secondary | ICD-10-CM | POA: Insufficient documentation

## 2013-02-03 MED ORDER — ONDANSETRON 4 MG PO TBDP: ORAL_TABLET | ORAL | Status: DC

## 2013-02-03 MED ORDER — ONDANSETRON 4 MG PO TBDP
4.0000 mg | ORAL_TABLET | Freq: Once | ORAL | Status: AC
  Administered 2013-02-03: 4 mg via ORAL
  Filled 2013-02-03: qty 1

## 2013-02-03 NOTE — Progress Notes (Signed)
Patient confirms he does not have a pcp and he does have Medicare insurance.  EDCM provided patient a list of pcps who accept Medicare insurance within a 5-10 mile radius of patient's zip code.  Patient thankful for resources.  No further needs at this time.

## 2013-02-03 NOTE — ED Provider Notes (Signed)
CSN: [id number]     Arrival date & time 02/03/13  1539 History   First MD Initiated Contact with Patient 02/03/13 1548     Chief Complaint  Patient presents with  . Sore Throat  . Emesis  . Nausea   (Consider location/radiation/quality/duration/timing/severity/associated sxs/prior Treatment) Patient is a 22 y.o. male presenting with vomiting. The history is provided by the patient (pt complains of nausea and vomiting.  ).  Emesis Severity:  Moderate Timing:  Constant Able to tolerate:  Liquids Progression:  Unchanged Chronicity:  Recurrent Recent urination:  Normal Relieved by:  Nothing Associated symptoms: no abdominal pain, no diarrhea and no headaches     Past Medical History  Diagnosis Date  . Periodic fever syndrome   . Juvenile arthritis   . Asthma    Past Surgical History  Procedure Laterality Date  . External ear surgery    . Inner ear surgery     History reviewed. No pertinent family history. History  Substance Use Topics  . Smoking status: Former Smoker    Types: Cigarettes  . Smokeless tobacco: Never Used  . Alcohol Use: No    Review of Systems  Constitutional: Negative for appetite change and fatigue.  HENT: Negative for congestion, ear discharge and sinus pressure.   Eyes: Negative for discharge.  Respiratory: Negative for cough.   Cardiovascular: Negative for chest pain.  Gastrointestinal: Positive for vomiting. Negative for abdominal pain and diarrhea.  Genitourinary: Negative for frequency and hematuria.  Musculoskeletal: Negative for back pain.  Skin: Negative for rash.  Neurological: Negative for seizures and headaches.  Psychiatric/Behavioral: Negative for hallucinations.    Allergies  Codeine  Home Medications   Current Outpatient Rx  Name  Route  Sig  Dispense  Refill  . acetaminophen (TYLENOL) 500 MG tablet   Oral   Take 1,000 mg by mouth every 6 (six) hours as needed for pain.         Marland Kitchen albuterol (PROVENTIL HFA;VENTOLIN HFA)  108 (90 BASE) MCG/ACT inhaler   Inhalation   Inhale 2 puffs into the lungs every 6 (six) hours as needed. Breathing         . ibuprofen (ADVIL,MOTRIN) 200 MG tablet   Oral   Take 800 mg by mouth every 6 (six) hours as needed for pain.         Marland Kitchen ondansetron (ZOFRAN ODT) 4 MG disintegrating tablet      4mg  ODT q6 hours prn nausea/vomit   20 tablet   0    BP 123/98  Pulse 108  Temp(Src) 98.2 F (36.8 C) (Oral)  Resp 18  Ht 6' (1.829 m)  Wt 234 lb (106.142 kg)  BMI 31.73 kg/m2  SpO2 96% Physical Exam  Constitutional: He is oriented to person, place, and time. He appears well-developed.  HENT:  Head: Normocephalic.  Eyes: Conjunctivae and EOM are normal. No scleral icterus.  Neck: Neck supple. No thyromegaly present.  Cardiovascular: Normal rate and regular rhythm.  Exam reveals no gallop and no friction rub.   No murmur heard. Pulmonary/Chest: No stridor. He has no wheezes. He has no rales. He exhibits no tenderness.  Abdominal: He exhibits no distension. There is no tenderness. There is no rebound.  Musculoskeletal: Normal range of motion. He exhibits no edema.  Lymphadenopathy:    He has no cervical adenopathy.  Neurological: He is oriented to person, place, and time. He exhibits normal muscle tone. Coordination normal.  Skin: No rash noted. No erythema.  Psychiatric: He has  a normal mood and affect. His behavior is normal.    ED Course  Procedures (including critical care time) Labs Review Labs Reviewed - No data to display Imaging Review No results found.  EKG Interpretation   None       MDM   1. Vomiting        Rickey Lennert, MD 02/03/13 [phone]

## 2013-02-03 NOTE — ED Notes (Signed)
Patient reports that he has a history of HIDS. Patient c/o N/V and sore throat x 2 days. Patient denies any blood in his emesis.

## 2013-02-03 NOTE — ED Notes (Signed)
Bed: WA09 Expected date:  Expected time:  Means of arrival:  Comments: Hold for TR9

## 2013-02-03 NOTE — ED Notes (Signed)
Pt waiting on paperwork from case manager.

## 2013-02-28 ENCOUNTER — Emergency Department (HOSPITAL_COMMUNITY)
Admission: EM | Admit: 2013-02-28 | Discharge: 2013-02-28 | Disposition: A | Discharge status: Home / Self Care | Payer: Medicare Other | Attending: Emergency Medicine | Admitting: Emergency Medicine

## 2013-02-28 ENCOUNTER — Encounter (HOSPITAL_COMMUNITY): Payer: Self-pay | Admitting: Emergency Medicine

## 2013-02-28 ENCOUNTER — Emergency Department (HOSPITAL_COMMUNITY): Payer: Medicare Other

## 2013-02-28 DIAGNOSIS — R51 Headache: Secondary | ICD-10-CM | POA: Insufficient documentation

## 2013-02-28 DIAGNOSIS — R509 Fever, unspecified: Secondary | ICD-10-CM | POA: Insufficient documentation

## 2013-02-28 DIAGNOSIS — Z87891 Personal history of nicotine dependence: Secondary | ICD-10-CM | POA: Insufficient documentation

## 2013-02-28 DIAGNOSIS — J3489 Other specified disorders of nose and nasal sinuses: Secondary | ICD-10-CM | POA: Insufficient documentation

## 2013-02-28 DIAGNOSIS — J45909 Unspecified asthma, uncomplicated: Secondary | ICD-10-CM | POA: Insufficient documentation

## 2013-02-28 DIAGNOSIS — Z79899 Other long term (current) drug therapy: Secondary | ICD-10-CM | POA: Insufficient documentation

## 2013-02-28 DIAGNOSIS — J40 Bronchitis, not specified as acute or chronic: Secondary | ICD-10-CM

## 2013-02-28 DIAGNOSIS — M1380 Other specified arthritis, unspecified site: Secondary | ICD-10-CM | POA: Insufficient documentation

## 2013-02-28 DIAGNOSIS — M041 Periodic fever syndromes: Secondary | ICD-10-CM | POA: Insufficient documentation

## 2013-02-28 MED ORDER — ALBUTEROL SULFATE HFA 108 (90 BASE) MCG/ACT IN AERS
2.0000 | INHALATION_SPRAY | Freq: Once | RESPIRATORY_TRACT | Status: AC
  Administered 2013-02-28: 2 via RESPIRATORY_TRACT
  Filled 2013-02-28 (×2): qty 6.7

## 2013-02-28 MED ORDER — PSEUDOEPHEDRINE HCL 30 MG PO TABS: 30.0000 mg | ORAL_TABLET | ORAL | Status: DC | PRN

## 2013-02-28 MED ORDER — ALBUTEROL SULFATE (5 MG/ML) 0.5% IN NEBU
INHALATION_SOLUTION | RESPIRATORY_TRACT | Status: AC
  Filled 2013-02-28: qty 0.5

## 2013-02-28 MED ORDER — PREDNISONE 20 MG PO TABS
60.0000 mg | ORAL_TABLET | Freq: Once | ORAL | Status: AC
  Administered 2013-02-28: 60 mg via ORAL
  Filled 2013-02-28: qty 3

## 2013-02-28 MED ORDER — ALBUTEROL SULFATE (5 MG/ML) 0.5% IN NEBU
2.5000 mg | INHALATION_SOLUTION | Freq: Once | RESPIRATORY_TRACT | Status: AC
  Administered 2013-02-28: 2.5 mg via RESPIRATORY_TRACT
  Filled 2013-02-28 (×2): qty 0.5

## 2013-02-28 MED ORDER — PREDNISONE 10 MG PO TABS: ORAL_TABLET | ORAL | Status: DC

## 2013-02-28 NOTE — ED Notes (Signed)
Pt has had fever diarrhea, sinus drainage, productive cough and severe headache times 4 days. Pt states his appetite is poor and feels very weak. Pt also noted To have new rash on chest and abdominal area.

## 2013-02-28 NOTE — ED Provider Notes (Signed)
CSN: [id number]     Arrival date & time 02/28/13  1116 History  This chart was scribed for Jaynie Crumble, PA working with Roney Marion, MD by Quintella Reichert, ED Scribe. This patient was seen in room WTR7/WTR7 and the patient's care was started at 12:07 PM.   Chief Complaint: URI Symptoms  The history is provided by the patient. No language interpreter was used.    HPI Comments: Rickey Becker is a 22 y.o. male with h/o asthma and periodic fever syndrome who presents to the Emergency Department complaining of 4 days of persistent worsening URI symptoms including headaches, fevers, productive cough, and rhinorrhea.  Pt states he has attempted to treat headaches with Tylenol and ibuprofen and used his inhaler for cough, without relief.  He states he had a fever up to 104 F several days ago.  He is afebrile on arrival.  Pt was diagnosed with periodic fever syndrome and placed at 12 and placed on steroids which he states "helped for a while but then started to make my chest worse."  Pt has no PCP and states "with my sickness being that rare it's hard to find someone"   Past Medical History  Diagnosis Date  . Periodic fever syndrome   . Juvenile arthritis   . Asthma     Past Surgical History  Procedure Laterality Date  . External ear surgery    . Inner ear surgery      History reviewed. No pertinent family history.   History  Substance Use Topics  . Smoking status: Former Smoker    Types: Cigarettes  . Smokeless tobacco: Never Used  . Alcohol Use: No     Review of Systems  All other systems reviewed and are negative.     Allergies  Codeine  Home Medications   Current Outpatient Rx  Name  Route  Sig  Dispense  Refill  . acetaminophen (TYLENOL) 500 MG tablet   Oral   Take 1,000 mg by mouth every 6 (six) hours as needed for pain.         Marland Kitchen albuterol (PROVENTIL HFA;VENTOLIN HFA) 108 (90 BASE) MCG/ACT inhaler   Inhalation   Inhale 2 puffs into the lungs every  6 (six) hours as needed. Breathing         . ibuprofen (ADVIL,MOTRIN) 200 MG tablet   Oral   Take 800 mg by mouth every 6 (six) hours as needed for pain.          BP 103/86  Pulse 83  Temp(Src) 98.3 F (36.8 C) (Oral)  Resp 18  SpO2 95%  Physical Exam  Nursing note and vitals reviewed. Constitutional: He is oriented to person, place, and time. He appears well-developed and well-nourished. No distress.  HENT:  Head: Normocephalic and atraumatic.  Right Ear: Tympanic membrane and ear canal normal.  Left Ear: Tympanic membrane and ear canal normal.  Nose: Rhinorrhea present.  Mouth/Throat: Uvula is midline, oropharynx is clear and moist and mucous membranes are normal. No oropharyngeal exudate, posterior oropharyngeal edema, posterior oropharyngeal erythema or tonsillar abscesses.  Eyes: EOM are normal.  Neck: Neck supple. No tracheal deviation present.  Cardiovascular: Normal rate, regular rhythm and normal heart sounds.   No murmur heard. Pulmonary/Chest: Effort normal and breath sounds normal. No respiratory distress. He has no wheezes. He has no rales.  Musculoskeletal: Normal range of motion.  Neurological: He is alert and oriented to person, place, and time.  Skin: Skin is warm and dry.  Psychiatric: He has a normal mood and affect. His behavior is normal.    ED Course  Procedures (including critical care time)  DIAGNOSTIC STUDIES: Oxygen Saturation is 95% on room air, adequate by my interpretation.    COORDINATION OF CARE: 12:11 PM-Informed pt that imaging is normal.  Discussed treatment plan which includes steroid treatment for bronchitis with pt at bedside and pt agreed to plan.    Labs Review Labs Reviewed - No data to display  Imaging Review Dg Chest 2 View  02/28/2013   CLINICAL DATA:  Cough, shortness of breath  EXAM: CHEST  2 VIEW  COMPARISON:  02/24/2006  FINDINGS: The heart size and mediastinal contours are within normal limits. Both lungs are clear.  The visualized skeletal structures are unremarkable.  IMPRESSION: No active cardiopulmonary disease.   Electronically Signed   By: Ruel Favors M.D.   On: 02/28/2013 11:54    EKG Interpretation   None       MDM   1. Bronchitis     Patient's with heparin respiratory symptoms, cough. He is a smoker. His exam is a nontoxic and he appears well. His chest x-ray is negative. His vital signs are normal. He is afebrile here. We'll start her on an inhaler, he does have history of asthma. Will also start him on a short course of steroids. Followup with primary care Dr.   Ceasar Mons Vitals:   02/28/13 1136  BP: 103/86  Pulse: 83  Temp: 98.3 F (36.8 C)  TempSrc: Oral  Resp: 18  SpO2: 95%   I personally performed the services described in this documentation, which was scribed in my presence. The recorded information has been reviewed and is accurate.    Lottie Mussel, PA-C 02/28/13 1235

## 2013-02-28 NOTE — ED Notes (Addendum)
Pt has left flank pain with cough. Pt has chronic illness shown in HX where pt has same symptoms as this illness in past. Pt states treated  symptoms and gave steroids in past. Pt states typically this will last 2 weeks for him

## 2013-03-01 NOTE — ED Provider Notes (Signed)
Medical screening examination/treatment/procedure(s) were performed by non-physician practitioner and as supervising physician I was immediately available for consultation/collaboration.  EKG Interpretation   None       Medical screening examination/treatment/procedure(s) were performed by non-physician practitioner and as supervising physician I was immediately available for consultation/collaboration.  EKG Interpretation   None         Roney Marion, MD 03/01/13 [phone]

## 2013-05-04 ENCOUNTER — Emergency Department (HOSPITAL_COMMUNITY)
Admission: EM | Admit: 2013-05-04 | Discharge: 2013-05-04 | Disposition: A | Discharge status: Home / Self Care | Payer: Medicare Other | Attending: Emergency Medicine | Admitting: Emergency Medicine

## 2013-05-04 ENCOUNTER — Encounter (HOSPITAL_COMMUNITY): Payer: Self-pay | Admitting: Emergency Medicine

## 2013-05-04 ENCOUNTER — Emergency Department (HOSPITAL_COMMUNITY): Payer: Medicare Other

## 2013-05-04 DIAGNOSIS — J029 Acute pharyngitis, unspecified: Secondary | ICD-10-CM | POA: Insufficient documentation

## 2013-05-04 DIAGNOSIS — J45909 Unspecified asthma, uncomplicated: Secondary | ICD-10-CM | POA: Insufficient documentation

## 2013-05-04 DIAGNOSIS — J40 Bronchitis, not specified as acute or chronic: Secondary | ICD-10-CM

## 2013-05-04 DIAGNOSIS — M1380 Other specified arthritis, unspecified site: Secondary | ICD-10-CM | POA: Insufficient documentation

## 2013-05-04 DIAGNOSIS — Z87891 Personal history of nicotine dependence: Secondary | ICD-10-CM | POA: Insufficient documentation

## 2013-05-04 DIAGNOSIS — Z8639 Personal history of other endocrine, nutritional and metabolic disease: Secondary | ICD-10-CM | POA: Insufficient documentation

## 2013-05-04 DIAGNOSIS — Z862 Personal history of diseases of the blood and blood-forming organs and certain disorders involving the immune mechanism: Secondary | ICD-10-CM | POA: Insufficient documentation

## 2013-05-04 DIAGNOSIS — Z720 Tobacco use: Secondary | ICD-10-CM

## 2013-05-04 MED ORDER — ALBUTEROL SULFATE HFA 108 (90 BASE) MCG/ACT IN AERS: 2.0000 | INHALATION_SPRAY | RESPIRATORY_TRACT | Status: DC | PRN

## 2013-05-04 MED ORDER — IBUPROFEN 800 MG PO TABS: 800.0000 mg | ORAL_TABLET | Freq: Three times a day (TID) | ORAL | Status: DC

## 2013-05-04 MED ORDER — PREDNISONE 20 MG PO TABS: 40.0000 mg | ORAL_TABLET | Freq: Every day | ORAL | Status: DC

## 2013-05-04 NOTE — Discharge Instructions (Signed)
Please follow up with your primary care physician in 1-2 days. If you do not have one please call the [facility] number listed above. Please take your inhaler 2 puffs every four hours for the next few days. Please take Prednisone as prescribed to help with bronchitis. Please use Motrin as prescribed to help with chest wall pain. Please read all discharge instructions and return precautions.    Bronchitis Bronchitis is inflammation of the airways that extend from the windpipe into the lungs (bronchi). The inflammation often causes mucus to develop, which leads to a cough. If the inflammation becomes severe, it may cause shortness of breath. CAUSES  Bronchitis may be caused by:   Viral infections.   Bacteria.   Cigarette smoke.   Allergens, pollutants, and other irritants.  SIGNS AND SYMPTOMS  The most common symptom of bronchitis is a frequent cough that produces mucus. Other symptoms include:  Fever.   Body aches.   Chest congestion.   Chills.   Shortness of breath.   Sore throat.  DIAGNOSIS  Bronchitis is usually diagnosed through a medical history and physical exam. Tests, such as chest X-rays, are sometimes done to rule out other conditions.  TREATMENT  You may need to avoid contact with whatever caused the problem (smoking, for example). Medicines are sometimes needed. These may include:  Antibiotics. These may be prescribed if the condition is caused by bacteria.  Cough suppressants. These may be prescribed for relief of cough symptoms.   Inhaled medicines. These may be prescribed to help open your airways and make it easier for you to breathe.   Steroid medicines. These may be prescribed for those with recurrent (chronic) bronchitis. HOME CARE INSTRUCTIONS  Get plenty of rest.   Drink enough fluids to keep your urine clear or pale yellow (unless you have a medical condition that requires fluid restriction). Increasing fluids may  help thin your secretions and will prevent dehydration.   Only take over-the-counter or prescription medicines as directed by your health care provider.  Only take antibiotics as directed. Make sure you finish them even if you start to feel better.  Avoid secondhand smoke, irritating chemicals, and strong fumes. These will make bronchitis worse. If you are a smoker, quit smoking. Consider using nicotine gum or skin patches to help control withdrawal symptoms. Quitting smoking will help your lungs heal faster.   Put a cool-mist humidifier in your bedroom at night to moisten the air. This may help loosen mucus. Change the water in the humidifier daily. You can also run the hot water in your shower and sit in the bathroom with the door closed for 5 10 minutes.   Follow up with your health care provider as directed.   Wash your hands frequently to avoid catching bronchitis again or spreading an infection to others.  SEEK MEDICAL CARE IF: Your symptoms do not improve after 1 week of treatment.  SEEK IMMEDIATE MEDICAL CARE IF:  Your fever increases.  You have chills.   You have chest pain.   You have worsening shortness of breath.   You have bloody sputum.  You faint.  You have lightheadedness.  You have a severe headache.   You vomit repeatedly. MAKE SURE YOU:   Understand these instructions.  Will watch your condition.  Will get help right away if you are not doing well or get worse. Document Released: 04/07/2005 Document Revised: 01/26/2013 Document Reviewed: 11/30/2012 [facility] Patient Information 2014 [place]. Smoking Cessation Quitting smoking  is important to your health and has many advantages. However, it is not always easy to quit since nicotine is a very addictive drug. Often times, people try 3 times or more before being able to quit. This document explains the best ways for you to prepare to quit smoking. Quitting takes hard work and a lot of  effort, but you can do it. ADVANTAGES OF QUITTING SMOKING  You will live longer, feel better, and live better.  Your body will feel the impact of quitting smoking almost immediately.  Within 20 minutes, blood pressure decreases. Your pulse returns to its normal level.  After 8 hours, carbon monoxide levels in the blood return to normal. Your oxygen level increases.  After 24 hours, the chance of having a heart attack starts to decrease. Your breath, hair, and body stop smelling like smoke.  After 48 hours, damaged nerve endings begin to recover. Your sense of taste and smell improve.  After 72 hours, the body is virtually free of nicotine. Your bronchial tubes relax and breathing becomes easier.  After 2 to 12 weeks, lungs can hold more air. Exercise becomes easier and circulation improves.  The risk of having a heart attack, stroke, cancer, or lung disease is greatly reduced.  After 1 year, the risk of coronary heart disease is cut in half.  After 5 years, the risk of stroke falls to the same as a nonsmoker.  After 10 years, the risk of lung cancer is cut in half and the risk of other cancers decreases significantly.  After 15 years, the risk of coronary heart disease drops, usually to the level of a nonsmoker.  If you are pregnant, quitting smoking will improve your chances of having a healthy baby.  The people you live with, especially any children, will be healthier.  You will have extra money to spend on things other than cigarettes. QUESTIONS TO THINK ABOUT BEFORE ATTEMPTING TO QUIT You may want to talk about your answers with your caregiver.  Why do you want to quit?  If you tried to quit in the past, what helped and what did not?  What will be the most difficult situations for you after you quit? How will you plan to handle them?  Who can help you through the tough times? Your family? Friends? A caregiver?  What pleasures do you get from smoking? What ways can you  still get pleasure if you quit? Here are some questions to ask your caregiver:  How can you help me to be successful at quitting?  What medicine do you think would be best for me and how should I take it?  What should I do if I need more help?  What is smoking withdrawal like? How can I get information on withdrawal? GET READY  Set a quit date.  Change your environment by getting rid of all cigarettes, ashtrays, matches, and lighters in your home, car, or work. Do not let people smoke in your home.  Review your past attempts to quit. Think about what worked and what did not. GET SUPPORT AND ENCOURAGEMENT You have a better chance of being successful if you have help. You can get support in many ways.  Tell your family, friends, and co-workers that you are going to quit and need their support. Ask them not to smoke around you.  Get individual, group, or telephone counseling and support. Programs are available at [employer] and health centers. Call your local health department for information about programs  in your area.  Spiritual beliefs and practices may help some smokers quit.  Download a "quit meter" on your computer to keep track of quit statistics, such as how long you have gone without smoking, cigarettes not smoked, and money saved.  Get a self-help book about quitting smoking and staying off of tobacco. LEARN NEW SKILLS AND BEHAVIORS  Distract yourself from urges to smoke. Talk to someone, go for a walk, or occupy your time with a task.  Change your normal routine. Take a different route to work. Drink tea instead of coffee. Eat breakfast in a different place.  Reduce your stress. Take a hot bath, exercise, or read a book.  Plan something enjoyable to do every day. Reward yourself for not smoking.  Explore interactive web-based programs that specialize in helping you quit. GET MEDICINE AND USE IT CORRECTLY Medicines can help you stop smoking and decrease the urge  to smoke. Combining medicine with the above behavioral methods and support can greatly increase your chances of successfully quitting smoking.  Nicotine replacement therapy helps deliver nicotine to your body without the negative effects and risks of smoking. Nicotine replacement therapy includes nicotine gum, lozenges, inhalers, nasal sprays, and skin patches. Some may be available over-the-counter and others require a prescription.  Antidepressant medicine helps people abstain from smoking, but how this works is unknown. This medicine is available by prescription.  Nicotinic receptor partial agonist medicine simulates the effect of nicotine in your brain. This medicine is available by prescription. Ask your caregiver for advice about which medicines to use and how to use them based on your health history. Your caregiver will tell you what side effects to look out for if you choose to be on a medicine or therapy. Carefully read the information on the package. Do not use any other product containing nicotine while using a nicotine replacement product.  RELAPSE OR DIFFICULT SITUATIONS Most relapses occur within the first 3 months after quitting. Do not be discouraged if you start smoking again. Remember, most people try several times before finally quitting. You may have symptoms of withdrawal because your body is used to nicotine. You may crave cigarettes, be irritable, feel very hungry, cough often, get headaches, or have difficulty concentrating. The withdrawal symptoms are only temporary. They are strongest when you first quit, but they will go away within 10 14 days. To reduce the chances of relapse, try to:  Avoid drinking alcohol. Drinking lowers your chances of successfully quitting.  Reduce the amount of caffeine you consume. Once you quit smoking, the amount of caffeine in your body increases and can give you symptoms, such as a rapid heartbeat, sweating, and anxiety.  Avoid smokers because  they can make you want to smoke.  Do not let weight gain distract you. Many smokers will gain weight when they quit, usually less than 10 pounds. Eat a healthy diet and stay active. You can always lose the weight gained after you quit.  Find ways to improve your mood other than smoking. FOR MORE INFORMATION  www.smokefree.gov  Document Released: 04/01/2001 Document Revised: 10/07/2011 Document Reviewed: 07/17/2011 [facility] Patient Information 2014 [place], [place].

## 2013-05-04 NOTE — ED Notes (Signed)
Per pt, cough for 4/5 days with cough and nasal congestion.  Sore throat.  Generalized fatigue.

## 2013-05-04 NOTE — ED Provider Notes (Signed)
Medical screening examination/treatment/procedure(s) were performed by non-physician practitioner and as supervising physician I was immediately available for consultation/collaboration.  EKG Interpretation   None         Gwyneth SproutWhitney Perpetua Elling, MD 05/04/13 1459

## 2013-05-04 NOTE — ED Provider Notes (Signed)
CSN: [id number]     Arrival date & time 05/04/13  1144 History  This chart was scribed for non-physician practitioner, Francee PiccoloJennifer Talena Neira, PA-C working with Gwyneth SproutWhitney Plunkett, MD by Greggory StallionKayla Andersen, ED scribe. This patient was seen in room WTR5/WTR5 and the patient's care was started at 12:50 PM.   Chief Complaint  Patient presents with  . Cough  . Sore Throat   The history is provided by the patient. No language interpreter was used.   HPI Comments: Rickey ChangMarcus A Hanrahan is a 23 y.o. male with history of asthma who presents to the Emergency Department complaining of intermittent sharp chest pain associated with tobacco use, inspiration, and posttussive, and nonproductive cough that started 4 days ago. He states he has been smoking cigarettes more due to increased stress. Pt has been using his inhaler as needed; the last time being yesterday night. Denies fever, chills, rhinorrhea, ear pain, sinus pressure, nausea, emesis, diarrhea, abdominal pain, leg swelling. Pt has had bronchitis in the past; the last time being 2-3 years ago. PERC negative.   Past Medical History  Diagnosis Date  . Periodic fever syndrome   . Juvenile arthritis   . Asthma    Past Surgical History  Procedure Laterality Date  . External ear surgery    . Inner ear surgery     History reviewed. No pertinent family history. History  Substance Use Topics  . Smoking status: Former Smoker    Types: Cigarettes  . Smokeless tobacco: Never Used  . Alcohol Use: No    Review of Systems  Constitutional: Negative for fever and chills.  HENT: Positive for sore throat. Negative for ear pain, rhinorrhea and sinus pressure.   Respiratory: Positive for cough.   Cardiovascular: Positive for chest pain. Negative for leg swelling.  Gastrointestinal: Negative for nausea, vomiting, abdominal pain and diarrhea.  All other systems reviewed and are negative.   Allergies  Codeine  Home Medications   Current Outpatient Rx  Name   Route  Sig  Dispense  Refill  . acetaminophen (TYLENOL) 500 MG tablet   Oral   Take 1,000 mg by mouth every 6 (six) hours as needed for pain.         Marland Kitchen. ibuprofen (ADVIL,MOTRIN) 200 MG tablet   Oral   Take 800 mg by mouth every 6 (six) hours as needed for pain.         . Multiple Vitamin (MULTIVITAMIN WITH MINERALS) TABS tablet   Oral   Take 1 tablet by mouth daily.         Marland Kitchen. albuterol (PROVENTIL HFA;VENTOLIN HFA) 108 (90 BASE) MCG/ACT inhaler   Inhalation   Inhale 2 puffs into the lungs every 4 (four) hours as needed for wheezing or shortness of breath (cough). Breathing   1 Inhaler   1   . ibuprofen (ADVIL,MOTRIN) 800 MG tablet   Oral   Take 1 tablet (800 mg total) by mouth 3 (three) times daily.   21 tablet   0   . predniSONE (DELTASONE) 20 MG tablet   Oral   Take 2 tablets (40 mg total) by mouth daily.   10 tablet   0    BP 133/68  Pulse 105  Temp(Src) 98.1 F (36.7 C) (Oral)  SpO2 97%  Physical Exam  Constitutional: He is oriented to person, place, and time. He appears well-developed and well-nourished. No distress.  HENT:  Head: Normocephalic and atraumatic.  Right Ear: Tympanic membrane, external ear and ear canal normal.  Left  Ear: Tympanic membrane, external ear and ear canal normal.  Nose: Nose normal.  Mouth/Throat: Uvula is midline, oropharynx is clear and moist and mucous membranes are normal.  Eyes: Conjunctivae are normal.  Neck: Normal range of motion. Neck supple.  Cardiovascular: Normal rate, regular rhythm and normal heart sounds.   Pulmonary/Chest: Effort normal and breath sounds normal. No respiratory distress. He has no wheezes. He has no rhonchi. He has no rales. He exhibits no tenderness.  Abdominal: Soft.  Musculoskeletal: Normal range of motion.  Neurological: He is alert and oriented to person, place, and time.  Skin: Skin is warm and dry. He is not diaphoretic.  Psychiatric: He has a normal mood and affect.    ED Course   Procedures (including critical care time)  DIAGNOSTIC STUDIES: Oxygen Saturation is 97% on RA, normal by my interpretation.    COORDINATION OF CARE: 12:55 PM-Discussed treatment plan which includes a steroid and an extra inhaler with pt at bedside and pt agreed to plan.   Labs Review Labs Reviewed - No data to display Imaging Review Dg Chest 2 View  05/04/2013   CLINICAL DATA:  Cough, congestion, achy feeling right chest  EXAM: CHEST  2 VIEW  COMPARISON:  02/28/2013  FINDINGS: Normal heart size, mediastinal contours, and pulmonary vascularity.  Mild chronic bronchitic changes.  Lungs otherwise clear.  No pleural effusion or pneumothorax.  No acute bony abnormalities.  IMPRESSION: Mild chronic bronchitic changes.  No acute abnormalities.   Electronically Signed   By: Ulyses Southward M.D.   On: 05/04/2013 12:44    EKG Interpretation   None       MDM   1. Bronchitis   2. Tobacco use     Filed Vitals:   05/04/13 1156  BP: 133/68  Pulse: 105  Temp: 98.1 F (36.7 C)    Afebrile, NAD, non-toxic appearing, AAOx4. Pt CXR negative for acute infiltrate or other acute cardiopulmonary process. Patients symptoms are consistent with URI, likely viral etiology. CP likely d/t cough and asthma. PERC negative. Discussed that antibiotics are not indicated for viral infections. Pt will be discharged with symptomatic treatment.  Verbalizes understanding and is agreeable with plan. Pt is hemodynamically stable & in NAD prior to dc. Patient is stable at time of discharge    I personally performed the services described in this documentation, which was scribed in my presence. The recorded information has been reviewed and is accurate.    Jeannetta Ellis, PA-C 05/04/13 1437

## 2013-06-02 ENCOUNTER — Emergency Department (HOSPITAL_COMMUNITY)
Admission: EM | Admit: 2013-06-02 | Discharge: 2013-06-02 | Disposition: A | Discharge status: Home / Self Care | Payer: Medicare Other | Attending: Emergency Medicine | Admitting: Emergency Medicine

## 2013-06-02 ENCOUNTER — Encounter (HOSPITAL_COMMUNITY): Payer: Self-pay | Admitting: Emergency Medicine

## 2013-06-02 ENCOUNTER — Emergency Department (HOSPITAL_COMMUNITY): Payer: Medicare Other

## 2013-06-02 DIAGNOSIS — R197 Diarrhea, unspecified: Secondary | ICD-10-CM | POA: Insufficient documentation

## 2013-06-02 DIAGNOSIS — K089 Disorder of teeth and supporting structures, unspecified: Secondary | ICD-10-CM | POA: Insufficient documentation

## 2013-06-02 DIAGNOSIS — Z79899 Other long term (current) drug therapy: Secondary | ICD-10-CM | POA: Insufficient documentation

## 2013-06-02 DIAGNOSIS — B370 Candidal stomatitis: Secondary | ICD-10-CM | POA: Insufficient documentation

## 2013-06-02 DIAGNOSIS — M041 Periodic fever syndromes: Secondary | ICD-10-CM | POA: Insufficient documentation

## 2013-06-02 DIAGNOSIS — Z9889 Other specified postprocedural states: Secondary | ICD-10-CM | POA: Insufficient documentation

## 2013-06-02 DIAGNOSIS — Z791 Long term (current) use of non-steroidal anti-inflammatories (NSAID): Secondary | ICD-10-CM | POA: Insufficient documentation

## 2013-06-02 DIAGNOSIS — K002 Abnormalities of size and form of teeth: Secondary | ICD-10-CM | POA: Insufficient documentation

## 2013-06-02 DIAGNOSIS — R590 Localized enlarged lymph nodes: Secondary | ICD-10-CM

## 2013-06-02 DIAGNOSIS — K0889 Other specified disorders of teeth and supporting structures: Secondary | ICD-10-CM

## 2013-06-02 DIAGNOSIS — IMO0002 Reserved for concepts with insufficient information to code with codable children: Secondary | ICD-10-CM | POA: Insufficient documentation

## 2013-06-02 DIAGNOSIS — R112 Nausea with vomiting, unspecified: Secondary | ICD-10-CM | POA: Insufficient documentation

## 2013-06-02 DIAGNOSIS — H9209 Otalgia, unspecified ear: Secondary | ICD-10-CM | POA: Insufficient documentation

## 2013-06-02 DIAGNOSIS — Z87891 Personal history of nicotine dependence: Secondary | ICD-10-CM | POA: Insufficient documentation

## 2013-06-02 DIAGNOSIS — R599 Enlarged lymph nodes, unspecified: Secondary | ICD-10-CM | POA: Insufficient documentation

## 2013-06-02 DIAGNOSIS — M1380 Other specified arthritis, unspecified site: Secondary | ICD-10-CM | POA: Insufficient documentation

## 2013-06-02 DIAGNOSIS — K029 Dental caries, unspecified: Secondary | ICD-10-CM | POA: Insufficient documentation

## 2013-06-02 DIAGNOSIS — J45909 Unspecified asthma, uncomplicated: Secondary | ICD-10-CM | POA: Insufficient documentation

## 2013-06-02 LAB — CBC WITH DIFFERENTIAL/PLATELET
BASOS ABS: 0 10*3/uL (ref 0.0–0.1)
Basophils Relative: 0 % (ref 0–1)
Eosinophils Absolute: 0 10*3/uL (ref 0.0–0.7)
Eosinophils Relative: 0 % (ref 0–5)
HEMATOCRIT: 40.2 % (ref 39.0–52.0)
HEMOGLOBIN: 13.9 g/dL (ref 13.0–17.0)
LYMPHS PCT: 7 % — AB (ref 12–46)
Lymphs Abs: 0.9 10*3/uL (ref 0.7–4.0)
MCH: 27.1 pg (ref 26.0–34.0)
MCHC: 34.6 g/dL (ref 30.0–36.0)
MCV: 78.4 fL (ref 78.0–100.0)
MONO ABS: 1.4 10*3/uL — AB (ref 0.1–1.0)
Monocytes Relative: 12 % (ref 3–12)
NEUTROS ABS: 9.8 10*3/uL — AB (ref 1.7–7.7)
NEUTROS PCT: 81 % — AB (ref 43–77)
Platelets: 239 10*3/uL (ref 150–400)
RBC: 5.13 MIL/uL (ref 4.22–5.81)
RDW: 16.1 % — AB (ref 11.5–15.5)
WBC: 12.2 10*3/uL — AB (ref 4.0–10.5)

## 2013-06-02 LAB — BASIC METABOLIC PANEL
BUN: 6 mg/dL (ref 6–23)
CHLORIDE: 91 meq/L — AB (ref 96–112)
CO2: 28 meq/L (ref 19–32)
CREATININE: 0.84 mg/dL (ref 0.50–1.35)
Calcium: 9.5 mg/dL (ref 8.4–10.5)
GFR calc non Af Amer: >90 mL/min (ref >90)
Glucose, Bld: 88 mg/dL (ref 70–99)
POTASSIUM: 3.4 meq/L — AB (ref 3.7–5.3)
Sodium: 136 mEq/L — ABNORMAL LOW (ref 137–147)

## 2013-06-02 MED ORDER — IOHEXOL 300 MG/ML  SOLN
100.0000 mL | Freq: Once | INTRAMUSCULAR | Status: AC | PRN
  Administered 2013-06-02: 80 mL via INTRAVENOUS

## 2013-06-02 MED ORDER — DEXAMETHASONE SODIUM PHOSPHATE 10 MG/ML IJ SOLN: 10.0000 mg | Freq: Once | INTRAMUSCULAR | Status: DC

## 2013-06-02 MED ORDER — KETOROLAC TROMETHAMINE 30 MG/ML IJ SOLN
30.0000 mg | Freq: Once | INTRAMUSCULAR | Status: DC
  Filled 2013-06-02: qty 1

## 2013-06-02 MED ORDER — ONDANSETRON HCL 4 MG/2ML IJ SOLN
4.0000 mg | Freq: Once | INTRAMUSCULAR | Status: AC
  Administered 2013-06-02: 4 mg via INTRAVENOUS
  Filled 2013-06-02: qty 2

## 2013-06-02 MED ORDER — POTASSIUM CHLORIDE CRYS ER 20 MEQ PO TBCR: 40.0000 meq | EXTENDED_RELEASE_TABLET | Freq: Once | ORAL | Status: DC

## 2013-06-02 MED ORDER — PENICILLIN V POTASSIUM 500 MG PO TABS: 500.0000 mg | ORAL_TABLET | Freq: Four times a day (QID) | ORAL | Status: DC

## 2013-06-02 MED ORDER — MORPHINE SULFATE 4 MG/ML IJ SOLN
4.0000 mg | Freq: Once | INTRAMUSCULAR | Status: AC
  Administered 2013-06-02: 4 mg via INTRAVENOUS
  Filled 2013-06-02: qty 1

## 2013-06-02 MED ORDER — SODIUM CHLORIDE 0.9 % IV BOLUS (SEPSIS)
1000.0000 mL | Freq: Once | INTRAVENOUS | Status: AC
  Administered 2013-06-02: 1000 mL via INTRAVENOUS

## 2013-06-02 MED ORDER — TRAMADOL HCL 50 MG PO TABS: 50.0000 mg | ORAL_TABLET | Freq: Four times a day (QID) | ORAL | Status: DC | PRN

## 2013-06-02 NOTE — Discharge Instructions (Signed)
Take Veetid as directed until gone. Take Tramadol as needed for pain. Refer to attached documents for more information. Follow up with a dentist from the list below.    Emergency Department Resource Guide 1) Find a Doctor and Pay Out of Pocket Although you won't have to find out who is covered by your insurance plan, it is a good idea to ask around and get recommendations. You will then need to call the office and see if the doctor you have chosen will accept you as a new patient and what types of options they offer for patients who are self-pay. Some doctors offer discounts or will set up payment plans for their patients who do not have insurance, but you will need to ask so you aren't surprised when you get to your appointment.  2) Contact Your Local Health Department Not all health departments have doctors that can see patients for sick visits, but many do, so it is worth a call to see if yours does. If you don't know where your local health department is, you can check in your phone book. The CDC also has a tool to help you locate your state's health department, and many state websites also have listings of all of their local health departments.  3) Find a Walk-in Clinic If your illness is not likely to be very severe or complicated, you may want to try a walk in clinic. These are popping up all over the country in pharmacies, drugstores, and shopping centers. They're usually staffed by nurse practitioners or physician assistants that have been trained to treat common illnesses and complaints. They're usually fairly quick and inexpensive. However, if you have serious medical issues or chronic medical problems, these are probably not your best option.  No Primary Care Doctor: - Call Health Connect at  [phone] - they can help you locate a primary care doctor that  accepts your insurance, provides certain services, etc. - Physician Referral Service- [phone]  Chronic Pain  Problems: Organization         Address  Phone   Notes  [facility]  [phone] Patients need to be referred by their primary care doctor.   Medication Assistance: Organization         Address  Phone   Notes  [facility] [address] [place], [place] [address] [phone] --Must be a resident of [facility] [place] -- Must have NO insurance coverage whatsoever (no Medicaid/ Medicare, etc.) -- The pt. MUST have a primary care doctor that directs their care regularly and follows them in the community   MedAssist  [phone]   [employer]  [phone]    Agencies that provide inexpensive medical care: Organization         Address  Phone   Notes  [facility]  [phone]   [facility]    [phone]) [phone]   [facility] [address] [place], [place] [address] [phone]   [facility] [address], [place] [phone]   [facility]    [phone]   [facility]    [phone]   [facility]  [address], [place] Phone:  [phone], Fax:  [phone] Hours of Operation:  9 am - 6 pm, M-F.  Also accepts Medicaid/Medicare and self-pay.  [facility]  [address]  [place], [place], [place] Phone: [phone], Fax: [phone]. Hours of Operation:  8:30 am - 5:30 pm, M-F.  Also accepts Medicaid and self-pay.  [facility] [place] [address], [place] Phone: [phone]   [place], [place], [place] [phone], Ext. [phone] Mondays & Thursdays: 7-9 AM.  First 15 patients are seen on a first come, first serve basis.    [place] Providers:  Organization         Address  Phone   Notes  [facility] [address], [place] [phone] Also  accepts self-pay patients.  [facility] [address], [place]  [phone]   [place], [address], [place] [phone]   [facility] [address] [place] [phone]   Lucianne Lei [address], [place]   [phone] Only accepts [place] Access [place] patients after they have their name applied to their card.   Self-Pay (no insurance) in [facility]:  Organization         Address  Phone   Notes  Sickle Cell Patients, [facility] [place] [phone]   [facility] [place] [phone]   [facility] [place]  [place], [place],  [phone]   [facility]/Dr. Osei-Bonsu  [address] [place] or [address], [place] [phone] Phone number for both [place] and [place] locations is the same.  [facility] [address], [place] [phone]   [facility] [address] [place] or [address] [phone]   [facility] [address], [place] [phone], phone; [phone], fax Sees patients 1st and 3rd Saturday of every month.  Must not qualify for public or private insurance (i.e. Medicaid, Medicare, [place] Health Choice, Veterans' Benefits)  Household income should be no more than 200% of the poverty level The clinic cannot treat you if you are pregnant or think you are pregnant  Sexually transmitted diseases are not treated at the clinic.    Dental Care: Organization         Address  Phone  Notes  [facility] [place] [phone] Accepts children up to age 34 who are enrolled in [place] or [place]; pregnant  women with a Medicaid card; and children who have applied for Medicaid or [place] Health Choice, but were declined, whose parents can pay a reduced fee at time of service.  [facility]  [address], [place] [phone] Accepts children up to age 24 who are enrolled in [place] or [place]; pregnant women with a Medicaid card; and children who have applied for Medicaid or [place] Health Choice, but were declined, whose parents can pay a reduced fee at time of service.  [facility]  [place] [phone] Patients are seen by appointment only. Walk-ins are not accepted. [place] will see patients 57 years of age and older. Monday - Tuesday (8am-5pm) Most Wednesdays (8:30-5pm) $30 per visit, cash only  [place]  [address], [place] [phone] Patients are seen by appointment only. Walk-ins are not accepted. [facility] will see patients 18 years of age and older. °One Wednesday Evening (Monthly: Volunteer Based).  $30 per visit, cash only  °[facility]  [phone] for adults; Children under age 4, call Graduate Pediatric Dentistry at [phone]. Children aged 4-14, please call [phone] to request a pediatric application. ° Dental services are provided in all areas of dental care including fillings, crowns and bridges, complete and partial dentures, implants, gum treatment, root canals, and extractions. Preventive care is also provided. Treatment is provided to both adults and children. °Patients are selected via a lottery and there is often a waiting list. °  °[facility] [address], °[place] ° [phone] [email or web address] °  °[facility] [address], [place], [place] [phone], Ext. [phone] Second and Fourth Thursday of each month, opens at 6:30 AM; Clinic ends at 9 AM.  Patients are  seen on a first-come first-served basis, and a limited number are seen during each clinic.  ° °[facility] ° [address], [place], [place] [phone]   Eligibility Requirements °You must have lived in [place], [place], or [place] counties for at least the last three months. °  You cannot be eligible for state or federal sponsored healthcare insurance, including Veterans Administration, Medicaid, or Medicare. °  You generally cannot be eligible for healthcare insurance through your employer.  °  How to apply: °Eligibility screenings are held every Tuesday and Wednesday afternoon from 1:00 pm until 4:00 pm. You do not need an appointment for the interview!  °[facility] [address], [place], [place] [phone]   °[facility]  [phone]   °[facility]  [phone]   °[facility]  [phone]   ° °Behavioral Health Resources in the Community: °Intensive Outpatient Programs °Organization         Address  Phone  Notes  °[facility] [address], [place], [place] [phone]   °[facility] [address], [place], [place] [phone]   °[facility] [address], [place], [place] ° [phone]   °[facility] [address],  °[place], [place] 1-800-853-5163 or 336-641-4981   °Substance Abuse Resources °Organization         Address  Phone  Notes  °[facility]  336-882-2125   °[facility]  [phone]   °[facility]  336-285-9073   °[facility]  336-845-3988   °Residential & Outpatient Substance Abuse Program  1-800-659-3381   °Psychological Services °Organization         Address  Phone  Notes  ° Health  336- [phone]   °[facility]  336- 378-7881   °[facility] [address], [place] 1-800-853-5163 or 336-641-4981   ° °Mobile Crisis  Teams °Organization         Address  Phone  Notes  °[facility], Mobile Crisis Care Unit  1-877-626-1772   °Assertive °Psychotherapeutic Services ° [address] [place], [place] 336-834-9664   °Sharon DeEsch [address] °[place] 336-554-5454   ° °Self-Help/Support Groups °Organization         Address  Phone             Notes  °[facility] [place] -   variety of support groups  336- 336-389-6044 Call for more information  Narcotics Anonymous (NA), [facility] [address], [employer] [place]  2 meetings at this location   Residential Facilities manager         Address  Phone  Notes  [facility] [place],    [place]  1-463-457-0038   [facility]  [address] [place] [address], [place], [place]   [place], [place] [phone] Admissions: 8am-3pm M-F  [facility] [place] [address],    [place], [place] [phone]   [facility] [address] [place], [place], [place]   [facility] [address]  [place], [place]   [facility] - Intensive Outpatient [address], [place], [place]   [facility] [place] [address] [address]  [place], [place] 1-640-386-3300 or [phone]   [facility] ([facility]) [address] [place], [place] Accepts Medicaid  [facility] [place] [address],  [place] 1-646-193-9807 Substance Abuse/Addiction Treatment   [facility] Organization         Address  Phone  Notes  [facility]  [phone]   Domenic Schwab, PhD [address] [place], [place]   (925) [phone] or [phone]   [facility], [place] [phone]   [facility] [address] [place], [place] [phone]  Insurance/Medicaid/sponsorship through [facility] and Families [address], [address]                                    [place], [place] [phone] [place] [address], [place] [phone]    Dr. Adele Schilder  [phone]   [facility] Dept. 1) [address] [place] 2) [place] 3)  [place] [address], [place] [phone]  [phone]   [place] [phone] or [phone] (After Hours)

## 2013-06-02 NOTE — ED Provider Notes (Signed)
CSN: [id number]     Arrival date & time 06/02/13  1602 History  This chart was scribed for non-physician practitioner working with Audree CamelScott T Goldston, MD by Ashley JacobsBrittany Andrews, ED scribe. This patient was seen in room WTR9/WTR9 and the patient's care was started at 5:00 PM.  First MD Initiated Contact with Patient 06/02/13 1606     No chief complaint on file.    (Consider location/radiation/quality/duration/timing/severity/associated sxs/prior Treatment) The history is provided by the patient and medical records. No language interpreter was used.   HPI Comments: Billey ChangMarcus A Carrico is a 23 y.o. male who presents to the Emergency Department complaining of left lower wisdom tooth pain with swelling for the past two days. Pt denies injury. Pt describes the pain as aching and throbbing. He has not visited the dentist in a few years. He reports having a mild fever of 102 yesterday which he tried to take Tylenol to lower to his temperature. Pt has a past medical hx of periodic fever syndrome which he usually treats with Tylenol. He has diarrhea and vomiting as associated symptoms. Nothing seems to resolve his symptoms. Denies similar prior episode.   Past Medical History  Diagnosis Date  . Periodic fever syndrome   . Juvenile arthritis   . Asthma    Past Surgical History  Procedure Laterality Date  . External ear surgery    . Inner ear surgery     No family history on file. History  Substance Use Topics  . Smoking status: Former Smoker    Types: Cigarettes  . Smokeless tobacco: Never Used  . Alcohol Use: No    Review of Systems  HENT: Positive for dental problem, ear pain and facial swelling.        Pt mentions that he get thrush on his tongue whenever he gets sick  Gastrointestinal: Positive for nausea, vomiting and diarrhea.  All other systems reviewed and are negative.      Allergies  Codeine  Home Medications   Current Outpatient Rx  Name  Route  Sig  Dispense  Refill  .  acetaminophen (TYLENOL) 500 MG tablet   Oral   Take 1,000 mg by mouth every 6 (six) hours as needed for pain.         [place] Kitchen. albuterol (PROVENTIL HFA;VENTOLIN HFA) 108 (90 BASE) MCG/ACT inhaler   Inhalation   Inhale 2 puffs into the lungs every 4 (four) hours as needed for wheezing or shortness of breath (cough). Breathing   1 Inhaler   1   . ibuprofen (ADVIL,MOTRIN) 200 MG tablet   Oral   Take 800 mg by mouth every 6 (six) hours as needed for pain.         [place] Kitchen. ibuprofen (ADVIL,MOTRIN) 800 MG tablet   Oral   Take 1 tablet (800 mg total) by mouth 3 (three) times daily.   21 tablet   0   . Multiple Vitamin (MULTIVITAMIN WITH MINERALS) TABS tablet   Oral   Take 1 tablet by mouth daily.         . predniSONE (DELTASONE) 20 MG tablet   Oral   Take 2 tablets (40 mg total) by mouth daily.   10 tablet   0    There were no vitals taken for this visit. Physical Exam  Constitutional: He is oriented to person, place, and time. He appears well-developed and well-nourished. No distress.  HENT:  Head: Normocephalic.  Mouth/Throat: Uvula is midline and mucous membranes are normal. Abnormal dentition. Dental caries  present. No dental abscesses, uvula swelling or lacerations. No oropharyngeal exudate or tonsillar abscesses.  No obvious carries on tooth #17 Thrush present on tongue  No obvious swelling or erythema to the affected tooth No sublingual or submental tenderness Tenderness along posterior cervical chain but no obvious erythema of the affected area  Eyes: Pupils are equal, round, and reactive to light.  Neck: Normal range of motion.  Pulmonary/Chest: Effort normal.  Musculoskeletal: Normal range of motion.  Neurological: He is alert and oriented to person, place, and time. No cranial nerve deficit. He exhibits normal muscle tone. Coordination normal.  Skin: Skin is warm and dry.  Psychiatric: He has a normal mood and affect. His behavior is normal.    ED Course  Procedures  (including critical care time) DIAGNOSTIC STUDIES: Oxygen Saturation is 100% by my interpretation.    COORDINATION OF CARE:  5:05 PM Discussed course of care with pt . Pt understands and agrees.  Labs Review Labs Reviewed  CBC WITH DIFFERENTIAL - Abnormal; Notable for the following:    WBC 12.2 (*)    RDW 16.1 (*)    Neutrophils Relative % 81 (*)    Neutro Abs 9.8 (*)    Lymphocytes Relative 7 (*)    Monocytes Absolute 1.4 (*)    All other components within normal limits  BASIC METABOLIC PANEL - Abnormal; Notable for the following:    Sodium 136 (*)    Potassium 3.4 (*)    Chloride 91 (*)    All other components within normal limits   Imaging Review Ct Soft Tissue Neck W Contrast  06/02/2013   CLINICAL DATA:  23 year old male with left lower wisdom tooth pain and swelling x2 days. Initial encounter.  EXAM: CT NECK WITH CONTRAST  TECHNIQUE: Multidetector CT imaging of the neck was performed using the standard protocol following the bolus administration of intravenous contrast.  CONTRAST:  80mL OMNIPAQUE IOHEXOL 300 MG/ML  SOLN  COMPARISON:  Head CT 06/22/2007.  Paranasal sinus CT 09/03/2006.  FINDINGS: Negative lung apices. No superior mediastinal lymphadenopathy. Negative visualized thorax osseous structures.  Thyroid, larynx, hypopharynx, oropharynx, parapharyngeal spaces and retropharyngeal space are within normal limits. Small volume retained secretions in the nasopharynx. Evidence of a small right lateral pharyngeal diverticulum (series 3, image 78). Submandibular and parotid glands are within normal limits. Visualized orbit soft tissues are within normal limits. Grossly negative visualized brain parenchyma.  Major vascular structures in the neck and at the skullbase are patent, including the left internal jugular vein.  Overall dentition appears within normal limits. No dental caries or periapical lucency is identified. The left mandible wisdom tooth is are rub did and within normal  limits. No sublingual or submandibular space inflammatory stranding or fluid is identified. Mildly asymmetric level 1 lymph nodes, with a left level 1B node measuring 9 mm short axis (small right level 1B nodes), and a right level 1A node measuring 12-13 mm short axis (small left level 1A nodes. Left level 2 and level 3 lymph nodes also appear mildly asymmetrically increased, the largest at the left level IIa station measuring 11 mm short axis. No masticator space inflammation. No acute osseous abnormality identified. There is mild to moderate left greater than right maxillary sinus alveolar recess mucosal thickening, but this is less pronounced than on the 08-30-2006 comparison. Other Visualized paranasal sinuses and mastoids are clear.  IMPRESSION: 1. No dental caries or odontogenic infection identified. No abscess or neck soft tissue space inflammatory process. 2. There is mild  left greater than right level 1 to level 3 lymph node asymmetry, which may be reactive. 3. Mild left greater than right maxillary sinus mucosal thickening, but less pronounced than in 08-Sep-2006.   Electronically Signed   By: Augusto Gamble M.D.   On: 06/02/2013 19:14    EKG Interpretation   None       MDM  I personally performed the services described in this documentation, which was scribed in my presence. The recorded information has been reviewed and is accurate.   Final diagnoses:  Pain, dental  Cervical adenopathy    8:06 PM Patient's labs show elevated WBC. Patient has a low grade temp. Patient given morphine and zofran for symptoms. Patient's CT neck shows left side lymphadenopathy without abscess or infection. Patient will be discharged with Veetid and Tramdol for pain. Patient instructed to return to the ED with worsening or concerning symptoms.     Emilia Beck, PA-C 06/02/13 2014

## 2013-06-02 NOTE — ED Notes (Signed)
Pt a+ox4, presents with c/o L sided lower tooth pain x2 days, pt reports "it activated my chronic illness", pt reports fevers, thrush "and i'm dehydrated".  9/10 pain to mouth.  Pt speaking full/clear sentences, no difficulty clearing secretions or maintaining airway.  Patchy white spots noted to tonight, otherwise MM pink/moist.  Pt reports last dose tylenol approx 4hr PTA in ED.  Skin PWD.  MAEI.  NAD.

## 2013-06-02 NOTE — Progress Notes (Signed)
   CARE MANAGEMENT ED NOTE 06/02/2013  Patient:  Rickey Becker,Rickey Becker   Account Number:  [id number]  Date Initiated:  06/02/2013  Documentation initiated by:  Radford PaxFERRERO,Asheton Viramontes  Subjective/Objective Assessment:   Patient presents to Ed with left lower wisdom tooth pain     Subjective/Objective Assessment Detail:   Patient with pmhx of asthma and juvenile arthritis, periodic fever syndrome.     Action/Plan:   Action/Plan Detail:   Anticipated DC Date:       Status Recommendation to Physician:   Result of Recommendation:    Other ED Services  Consult Working Plan    DC Planning Services  Other  PCP issues    Choice offered to / List presented to:            Status of service:  Completed, signed off  ED Comments:   ED Comments Detail:  EDCM spoke to patient at bedside.  Patient confirms he does not have [place] pcp.  EDCM provided patient with [place] list of pcps who accept Medicare insurance within [place] ten mile radius of patient's zip code.  No further EDCM needs at this time.

## 2013-06-03 ENCOUNTER — Telehealth (HOSPITAL_COMMUNITY): Payer: Self-pay

## 2013-06-03 NOTE — ED Notes (Signed)
Pharmacy showing pt having PCN allergy.  EPIC allergy tab shows allergy to only codeine.  Pharmacy to try and reach pt to clarify

## 2013-06-03 NOTE — ED Provider Notes (Signed)
Medical screening examination/treatment/procedure(s) were performed by non-physician practitioner and as supervising physician I was immediately available for consultation/collaboration.  EKG Interpretation   None         Shanna CiscoMegan E Unnamed Hino, MD 06/03/13 [phone]

## 2013-12-08 ENCOUNTER — Emergency Department (HOSPITAL_COMMUNITY)
Admission: EM | Admit: 2013-12-08 | Discharge: 2013-12-08 | Disposition: A | Discharge status: Home / Self Care | Payer: Medicare Other | Attending: Emergency Medicine | Admitting: Emergency Medicine

## 2013-12-08 ENCOUNTER — Encounter (HOSPITAL_COMMUNITY): Payer: Self-pay | Admitting: Emergency Medicine

## 2013-12-08 DIAGNOSIS — Z87891 Personal history of nicotine dependence: Secondary | ICD-10-CM | POA: Insufficient documentation

## 2013-12-08 DIAGNOSIS — H9209 Otalgia, unspecified ear: Secondary | ICD-10-CM | POA: Insufficient documentation

## 2013-12-08 DIAGNOSIS — Z8739 Personal history of other diseases of the musculoskeletal system and connective tissue: Secondary | ICD-10-CM | POA: Insufficient documentation

## 2013-12-08 DIAGNOSIS — Z79899 Other long term (current) drug therapy: Secondary | ICD-10-CM | POA: Diagnosis not present

## 2013-12-08 DIAGNOSIS — J029 Acute pharyngitis, unspecified: Secondary | ICD-10-CM | POA: Diagnosis present

## 2013-12-08 DIAGNOSIS — M041 Periodic fever syndromes: Secondary | ICD-10-CM | POA: Diagnosis not present

## 2013-12-08 DIAGNOSIS — J45909 Unspecified asthma, uncomplicated: Secondary | ICD-10-CM | POA: Diagnosis not present

## 2013-12-08 LAB — RAPID STREP SCREEN (MED CTR MEBANE ONLY): STREPTOCOCCUS, GROUP A SCREEN (DIRECT): NEGATIVE

## 2013-12-08 MED ORDER — SODIUM CHLORIDE 0.9 % IV BOLUS (SEPSIS)
1000.0000 mL | Freq: Once | INTRAVENOUS | Status: AC
  Administered 2013-12-08: 1000 mL via INTRAVENOUS

## 2013-12-08 MED ORDER — KETOROLAC TROMETHAMINE 30 MG/ML IJ SOLN
30.0000 mg | Freq: Once | INTRAMUSCULAR | Status: AC
  Administered 2013-12-08: 30 mg via INTRAVENOUS
  Filled 2013-12-08: qty 1

## 2013-12-08 NOTE — Discharge Instructions (Signed)
Return to the ED with any concerns including difficulty breathing or swallowing, vomiting and not able to keep down liquids, decreased level of alertness/lethargy, or any other alarming symptoms °

## 2013-12-08 NOTE — ED Notes (Signed)
Pt c/o mainly LT ear pain and sore throat x 2 days.  Also states joint pain, chills, h/a.

## 2013-12-08 NOTE — ED Provider Notes (Signed)
CSN: [id number]     Arrival date & time 12/08/13  1115 History   First MD Initiated Contact with Patient 12/08/13 1131     Chief Complaint  Patient presents with  . Otalgia  . Sore Throat     (Consider location/radiation/quality/duration/timing/severity/associated sxs/prior Treatment) HPI Pt presenting with c/o left ear pain and sore throat over the past 2 days.  He also states he has diffuse body aches and joint aches with occassional chills.  He states he has hx of HIDS, periodic fever syndrome.  Pain with swallowing but no difficulty swallowing.  No difficulty breathing.  Has continued to drink liquids well.  There are no other associated systemic symptoms, there are no other alleviating or modifying factors.   Past Medical History  Diagnosis Date  . Periodic fever syndrome   . Juvenile arthritis   . Asthma    Past Surgical History  Procedure Laterality Date  . External ear surgery    . Inner ear surgery     History reviewed. No pertinent family history. History  Substance Use Topics  . Smoking status: Former Smoker    Types: Cigarettes  . Smokeless tobacco: Never Used  . Alcohol Use: No    Review of Systems ROS reviewed and all otherwise negative except for mentioned in HPI   Allergies  Codeine  Home Medications   Prior to Admission medications   Medication Sig Start Date End Date Taking? Authorizing Provider  acetaminophen (TYLENOL) 500 MG tablet Take 1,000 mg by mouth every 6 (six) hours as needed for pain.   Yes Historical Provider, MD  albuterol (PROVENTIL HFA;VENTOLIN HFA) 108 (90 BASE) MCG/ACT inhaler Inhale 2 puffs into the lungs every 4 (four) hours as needed for wheezing or shortness of breath (cough). Breathing 05/04/13  Yes Jennifer L Piepenbrink, PA-C  Aspirin-Acetaminophen-Caffeine (GOODY HEADACHE PO) Take 1 packet by mouth every 6 (six) hours as needed (for headache).   Yes Historical Provider, MD  ibuprofen (ADVIL,MOTRIN) 200 MG tablet Take 800 mg by  mouth every 6 (six) hours as needed for pain.   Yes Historical Provider, MD  Multiple Vitamin (MULTIVITAMIN WITH MINERALS) TABS tablet Take 1 tablet by mouth daily.   Yes Historical Provider, MD  phenol (CHLORASEPTIC) 1.4 % LIQD Use as directed 1 spray in the mouth or throat every 4 (four) hours as needed for throat irritation / pain.   Yes Historical Provider, MD   BP 109/47  Pulse 62  Temp(Src) 98.1 F (36.7 C) (Oral)  Resp 18  SpO2 100% Vitals reviewed Physical Exam Physical Examination: General appearance - alert, well appearing, and in no distress Mental status - alert, oriented to person, place, and time Eyes - no conjunctival injection Ears- TMs with small amount of fluid bilaterally, EACs normal bilaterally Mouth - mucous membranes moist, pharynx normal without lesions Chest - clear to auscultation, no wheezes, rales or rhonchi, symmetric air entry Heart - normal rate, regular rhythm, normal S1, S2, no murmurs, rubs, clicks or gallops Abdomen - soft, nontender, nondistended, no masses or organomegaly Extremities - peripheral pulses normal, no pedal edema, no clubbing or cyanosis Skin - normal coloration and turgor, no rashes  ED Course  Procedures (including critical care time) Labs Review Labs Reviewed  RAPID STREP SCREEN  CULTURE, GROUP A STREP    Imaging Review No results found.   EKG Interpretation None      MDM   Final diagnoses:  Pharyngitis  Periodic fever syndrome    Pt presenting with c/o  pharyngitis and ear pain- states he feels dehydrated and is requesting IV fluids which were provided.  Rapid strep negative, culture pending.   Patient is overall nontoxic and well hydrated in appearance.  Pt encouraged to use resources given to him by case manager at last viist to find a primary care doctor.  Discharged with strict return precautions.  Pt agreeable with plan.     Ethelda Chick, MD 12/10/13 [phone]

## 2013-12-10 LAB — CULTURE, GROUP A STREP

## 2014-03-06 ENCOUNTER — Encounter (HOSPITAL_COMMUNITY): Payer: Self-pay | Admitting: Emergency Medicine

## 2014-03-06 ENCOUNTER — Emergency Department (HOSPITAL_COMMUNITY)
Admission: EM | Admit: 2014-03-06 | Discharge: 2014-03-06 | Disposition: A | Discharge status: Home / Self Care | Payer: Medicare Other | Attending: Emergency Medicine | Admitting: Emergency Medicine

## 2014-03-06 DIAGNOSIS — M549 Dorsalgia, unspecified: Secondary | ICD-10-CM | POA: Diagnosis not present

## 2014-03-06 DIAGNOSIS — J45909 Unspecified asthma, uncomplicated: Secondary | ICD-10-CM | POA: Insufficient documentation

## 2014-03-06 DIAGNOSIS — Z9889 Other specified postprocedural states: Secondary | ICD-10-CM | POA: Insufficient documentation

## 2014-03-06 DIAGNOSIS — Z87891 Personal history of nicotine dependence: Secondary | ICD-10-CM | POA: Diagnosis not present

## 2014-03-06 DIAGNOSIS — G43909 Migraine, unspecified, not intractable, without status migrainosus: Secondary | ICD-10-CM | POA: Diagnosis present

## 2014-03-06 DIAGNOSIS — G43809 Other migraine, not intractable, without status migrainosus: Secondary | ICD-10-CM | POA: Diagnosis not present

## 2014-03-06 DIAGNOSIS — Z8639 Personal history of other endocrine, nutritional and metabolic disease: Secondary | ICD-10-CM | POA: Diagnosis not present

## 2014-03-06 MED ORDER — KETOROLAC TROMETHAMINE 30 MG/ML IJ SOLN
30.0000 mg | Freq: Once | INTRAMUSCULAR | Status: AC
  Administered 2014-03-06: 30 mg via INTRAVENOUS
  Filled 2014-03-06: qty 1

## 2014-03-06 MED ORDER — DIPHENHYDRAMINE HCL 50 MG/ML IJ SOLN
25.0000 mg | Freq: Once | INTRAMUSCULAR | Status: AC
  Administered 2014-03-06: 25 mg via INTRAVENOUS
  Filled 2014-03-06: qty 1

## 2014-03-06 MED ORDER — METOCLOPRAMIDE HCL 5 MG/ML IJ SOLN
10.0000 mg | Freq: Once | INTRAMUSCULAR | Status: AC
  Administered 2014-03-06: 10 mg via INTRAVENOUS
  Filled 2014-03-06: qty 2

## 2014-03-06 MED ORDER — ONDANSETRON HCL 4 MG PO TABS: 4.0000 mg | ORAL_TABLET | Freq: Four times a day (QID) | ORAL | Status: DC

## 2014-03-06 MED ORDER — SODIUM CHLORIDE 0.9 % IV BOLUS (SEPSIS)
1000.0000 mL | Freq: Once | INTRAVENOUS | Status: AC
  Administered 2014-03-06: 1000 mL via INTRAVENOUS

## 2014-03-06 NOTE — ED Provider Notes (Signed)
CSN: [id number]     Arrival date & time 03/06/14  [id number] History   First MD Initiated Contact with Patient 03/06/14 0914     Chief Complaint  Patient presents with  . Migraine     (Consider location/radiation/quality/duration/timing/severity/associated sxs/prior Treatment) HPI   Patient to the ED with complaints of a migraine that started at 5 am this morning which woke him up from his sleep. Started as a 8/10, then after OTC pain medications it continued to get worse and is now 10/10. Last night he felt as though he had subjective fevers and chills. Some tonsil swelling and tenderness. Generalized back aches. Denies URI symptoms of cough, nasal congestion, sore throat. He denies having recent head injury, loc, recent illness, injury neck pain. + photophobia, no vision changes. He describes the headache as throbbing and bitemporal/frontal. He has headache triggers of smells and weather changes.  Past Medical History  Diagnosis Date  . Periodic fever syndrome   . Juvenile arthritis   . Asthma    Past Surgical History  Procedure Laterality Date  . External ear surgery    . Inner ear surgery     No family history on file. History  Substance Use Topics  . Smoking status: Former Smoker    Types: Cigarettes  . Smokeless tobacco: Never Used  . Alcohol Use: No    Review of Systems  10 Systems reviewed and are negative for acute change except as noted in the HPI.    Allergies  Codeine  Home Medications   Prior to Admission medications   Medication Sig Start Date End Date Taking? Authorizing Provider  acetaminophen (TYLENOL) 500 MG tablet Take 500 mg by mouth every 6 (six) hours as needed for mild pain or headache.    Yes Historical Provider, MD  albuterol (PROVENTIL HFA;VENTOLIN HFA) 108 (90 BASE) MCG/ACT inhaler Inhale 2 puffs into the lungs every 4 (four) hours as needed for wheezing or shortness of breath (cough). Breathing 05/04/13  Yes Jennifer L Piepenbrink, PA-C   Aspirin-Acetaminophen-Caffeine (GOODY HEADACHE PO) Take 1 packet by mouth every 6 (six) hours as needed (for headache).   Yes Historical Provider, MD  ibuprofen (ADVIL,MOTRIN) 200 MG tablet Take 200 mg by mouth every 6 (six) hours as needed for pain.    Yes Historical Provider, MD  Nasal Moisturizer Combination (LITTLE REMEDIES FOR NOSES) SOLN Place 1 spray into the nose 3 (three) times daily as needed (for nasal congestion).   Yes Historical Provider, MD  ondansetron (ZOFRAN) 4 MG tablet Take 1 tablet (4 mg total) by mouth every 6 (six) hours. 03/06/14   Anitha Kreiser Irine SealG Jenson Beedle, PA-C   BP 138/75 mmHg  Pulse 107  Temp(Src) 98.6 F (37 C) (Oral)  Resp 18  SpO2 99% Physical Exam  Constitutional: He appears well-developed and well-nourished. No distress.  HENT:  Head: Normocephalic and atraumatic.  Eyes: Pupils are equal, round, and reactive to light.  Neck: Normal range of motion. Neck supple.  Cardiovascular: Normal rate and regular rhythm.   Pulmonary/Chest: Effort normal.  Abdominal: Soft.  Neurological: He is alert.  Cranial nerves II-VIII and X-XII evaluated and show no deficits. Pt alert and oriented x 3 Upper and lower extremity strength is symmetrical and physiologic Normal muscular tone No facial droop Coordination intact, no limb ataxia, finger-nose-finger normal Rapid alternating movements normal No pronator drift   Skin: Skin is warm and dry.  Nursing note and vitals reviewed.   ED Course  Procedures (including critical care time) Labs Review  Labs Reviewed - No data to display  Imaging Review No results found.   EKG Interpretation None      MDM   Final diagnoses:  Other migraine without status migrainosus, not intractable    The patient denies any symptoms of neurological impairment or TIA's; no amaurosis, diplopia, dysphasia, or unilateral disturbance of motor or sensory function. No loss of balance or vertigo.   Medications  sodium chloride 0.9 % bolus  1,000 mL (0 mLs Intravenous Stopped 03/06/14 1139)  diphenhydrAMINE (BENADRYL) injection 25 mg (25 mg Intravenous Given 03/06/14 1032)  ketorolac (TORADOL) 30 MG/ML injection 30 mg (30 mg Intravenous Given 03/06/14 1032)  metoCLOPramide (REGLAN) injection 10 mg (10 mg Intravenous Given 03/06/14 1032)   Pt is well appearing. Medications completely resolved his symptoms and his pain has completely resolved. Pt resting and had to be woken up for reassement. Will give Neuro f/u in case his headache returns. Rx Zofran can return OTC pain meds as needed.  23 y.o.Alphonzo CruiseMarcus A Prieur's evaluation in the Emergency Department is complete. It has been determined that no acute conditions requiring further emergency intervention are present at this time. The patient/guardian have been advised of the diagnosis and plan. We have discussed signs and symptoms that warrant return to the ED, such as changes or worsening in symptoms.  Vital signs are stable at discharge. Filed Vitals:   03/06/14 0858  BP: 138/75  Pulse: 107  Temp: 98.6 F (37 C)  Resp: 18    Patient/guardian has voiced understanding and agreed to follow-up with the PCP or specialist.   Dorthula Matasiffany G Zareena Willis, PA-C 03/06/14 1147  Benny LennertJoseph L Zammit, MD 03/06/14 [phone]

## 2014-03-06 NOTE — ED Notes (Signed)
Pt states migraine started lat night, woke up to worse migraine Pt c/o nausea but no vomiting.

## 2014-03-06 NOTE — ED Notes (Signed)
Pt alert, nad, resp even unlabored, skin pwd, denies needs, ambulates to discharge 

## 2014-03-06 NOTE — Discharge Instructions (Signed)

## 2014-05-24 ENCOUNTER — Emergency Department (HOSPITAL_COMMUNITY)
Admission: EM | Admit: 2014-05-24 | Discharge: 2014-05-24 | Disposition: A | Discharge status: Home / Self Care | Payer: Medicare Other | Attending: Emergency Medicine | Admitting: Emergency Medicine

## 2014-05-24 ENCOUNTER — Encounter (HOSPITAL_COMMUNITY): Payer: Self-pay

## 2014-05-24 DIAGNOSIS — Z79899 Other long term (current) drug therapy: Secondary | ICD-10-CM | POA: Diagnosis not present

## 2014-05-24 DIAGNOSIS — K047 Periapical abscess without sinus: Secondary | ICD-10-CM | POA: Diagnosis not present

## 2014-05-24 DIAGNOSIS — Z87891 Personal history of nicotine dependence: Secondary | ICD-10-CM | POA: Diagnosis not present

## 2014-05-24 DIAGNOSIS — M0890 Juvenile arthritis, unspecified, unspecified site: Secondary | ICD-10-CM | POA: Diagnosis not present

## 2014-05-24 DIAGNOSIS — K088 Other specified disorders of teeth and supporting structures: Secondary | ICD-10-CM | POA: Insufficient documentation

## 2014-05-24 DIAGNOSIS — Z8639 Personal history of other endocrine, nutritional and metabolic disease: Secondary | ICD-10-CM | POA: Diagnosis not present

## 2014-05-24 DIAGNOSIS — K0889 Other specified disorders of teeth and supporting structures: Secondary | ICD-10-CM

## 2014-05-24 DIAGNOSIS — J45909 Unspecified asthma, uncomplicated: Secondary | ICD-10-CM | POA: Diagnosis not present

## 2014-05-24 DIAGNOSIS — K029 Dental caries, unspecified: Secondary | ICD-10-CM | POA: Insufficient documentation

## 2014-05-24 MED ORDER — PENICILLIN V POTASSIUM 500 MG PO TABS: 500.0000 mg | ORAL_TABLET | Freq: Four times a day (QID) | ORAL | Status: DC

## 2014-05-24 MED ORDER — TRAMADOL-ACETAMINOPHEN 37.5-325 MG PO TABS: 1.0000 | ORAL_TABLET | Freq: Four times a day (QID) | ORAL | Status: DC | PRN

## 2014-05-24 NOTE — Discharge Instructions (Signed)
Take the prescribed medication as directed. Follow-up with dentist-- see referral and resource guide.  Call to schedule appt. Return to the ED for new or worsening symptoms.   Emergency Department Resource Guide 1) Find a Doctor and Pay Out of Pocket Although you won't have to find out who is covered by your insurance plan, it is a good idea to ask around and get recommendations. You will then need to call the office and see if the doctor you have chosen will accept you as a new patient and what types of options they offer for patients who are self-pay. Some doctors offer discounts or will set up payment plans for their patients who do not have insurance, but you will need to ask so you aren't surprised when you get to your appointment.  2) Contact Your Local Health Department Not all health departments have doctors that can see patients for sick visits, but many do, so it is worth a call to see if yours does. If you don't know where your local health department is, you can check in your phone book. The CDC also has a tool to help you locate your state's health department, and many state websites also have listings of all of their local health departments.  3) Find a Walk-in Clinic If your illness is not likely to be very severe or complicated, you may want to try a walk in clinic. These are popping up all over the country in pharmacies, drugstores, and shopping centers. They're usually staffed by nurse practitioners or physician assistants that have been trained to treat common illnesses and complaints. They're usually fairly quick and inexpensive. However, if you have serious medical issues or chronic medical problems, these are probably not your best option.  No Primary Care Doctor: - Call Health Connect at  [phone] - they can help you locate a primary care doctor that  accepts your insurance, provides certain services, etc. - Physician Referral Service- [phone]  Chronic Pain  Problems: Organization         Address  Phone   Notes  [facility]  [phone] Patients need to be referred by their primary care doctor.   Medication Assistance: Organization         Address  Phone   Notes  [facility] [address] [place], [place] [address] [phone] --Must be a resident of [place] -- Must have NO insurance coverage whatsoever (no Medicaid/ Medicare, etc.) -- The pt. MUST have a primary care doctor that directs their care regularly and follows them in the community   MedAssist  [phone]   [employer]  [phone]    Agencies that provide inexpensive medical care: Organization         Address  Phone   Notes  [facility]  [phone]   [facility]    [phone]   [facility] [address] [place], [place] [address] [phone]) [phone]   [facility] [address], [place] [phone]   [facility]    [phone]) [phone]   [facility]    [phone]   [facility]  [address], [place] Phone:  [phone], Fax:  [phone]) [phone] Hours of Operation:  9 am - 6 pm, M-F.  Also accepts Medicaid/Medicare and self-pay.  [facility]  [address]  [address], [place] Phone: [phone], Fax: [phone]. Hours of Operation:  8:30 am - 5:30 pm, M-F.  Also accepts Medicaid and self-pay.  [facility] [place] [address] [place] Phone: [phone]   [place], [place], [place] [phone], Ext. [phone] Mondays & Thursdays: 7-9 AM.  First 15 patients are seen on a first come, first serve basis.    [place] Providers:  Organization         Address  Phone   Notes  [facility] [address], [place] [phone] Also  accepts self-pay patients.  [facility] [address], [place]  [phone]   [place] [address] [place], [place] [phone]   [facility] [address] [place] [phone]   Lucianne Lei [address], [place]   [phone] Only accepts [place] Access [place] patients after they have their name applied to their card.   Self-Pay (no insurance) in [facility]:  Organization         Address  Phone   Notes  Sickle Cell Patients, [facility] [place] [phone]   [facility] [place] [phone]   [facility] [place]  [place], [place], [place] [phone]   [facility]/Dr. Osei-Bonsu  [address] [place] [address], [place] or [address], [place] [phone] Phone number for both [place] and [place] locations is the same.  [facility] [address] [place] [phone]   [facility] [address], [place] or [address] [phone]   [facility] [address], [place] [phone], phone; [phone], fax Sees patients 1st and 3rd Saturday of every month.  Must not qualify for public or private insurance (i.e. Medicaid, Medicare, [place] Health Choice, Veterans' Benefits)  Household income should be no more than 200% of the poverty level The clinic cannot treat you if you are pregnant or think you are pregnant  Sexually transmitted diseases are not treated at the clinic.    Dental Care: Organization         Address  Phone  Notes  [facility] [phone] Accepts children up to age 14 who are enrolled in [place] or [place]; pregnant  women with a Medicaid card; and children who have applied for Medicaid or [place] Health Choice, but were declined, whose parents can pay a reduced fee at time of service.  [facility]  [address], [place] [phone] Accepts children up to age 57 who are enrolled in [place] or [place]; pregnant women with a Medicaid card; and children who have applied for Medicaid or [place] Health Choice, but were declined, whose parents can pay a reduced fee at time of service.  [facility]  [place] [phone] Patients are seen by appointment only. Walk-ins are not accepted. [place] will see patients 65 years of age and older. Monday - Tuesday (8am-5pm) Most Wednesdays (8:30-5pm) $30 per visit, cash only  [place]  [address], [place] [phone] Patients are seen by appointment only. Walk-ins are not accepted. [facility] will see patients 18 years of age and older. °One Wednesday Evening (Monthly: Volunteer Based).  $30 per visit, cash only  °[facility]  [phone] for adults; Children under age 4, call Graduate Pediatric Dentistry at [phone]. Children aged 4-14, please call [phone] to request a pediatric application. ° Dental services are provided in all areas of dental care including fillings, crowns and bridges, complete and partial dentures, implants, gum treatment, root canals, and extractions. Preventive care is also provided. Treatment is provided to both adults and children. °Patients are selected via a lottery and there is often a waiting list. °  °[facility] [address], °[place] ° [phone] [email or web address] °  °[facility] [address], [place], [place] [phone], Ext. [phone] Second and Fourth Thursday of each month, opens at 6:30 AM; Clinic ends at 9 AM.  Patients are  seen on a first-come first-served basis, and a limited number are seen during each clinic.  ° °[facility] ° [address], [place], [place] [phone]   Eligibility Requirements °You must have lived in [place], [place], or [place] counties for at least the last three months. °  You cannot be eligible for state or federal sponsored healthcare insurance, including Veterans Administration, Medicaid, or Medicare. °  You generally cannot be eligible for healthcare insurance through your employer.  °  How to apply: °Eligibility screenings are held every Tuesday and Wednesday afternoon from 1:00 pm until 4:00 pm. You do not need an appointment for the interview!  °[facility] [address], [place], [place] [phone]   °[facility]  [phone]   °[facility]  [phone]   °[facility]  336-570-6415   ° °Behavioral Health Resources in the Community: °Intensive Outpatient Programs °Organization         Address  Phone  Notes  °[facility] [address], [place], [place] [phone]   °[facility] [address], [place], [place] [phone]   °[facility] [address], [place], [place] ° [phone]   °[facility] [address],  °[place], [place] 1-800-853-5163 or 336-641-4981   °Substance Abuse Resources °Organization         Address  Phone  Notes  °[facility]  336-882-2125   °[facility]  336-784-9470   °[facility]  336-285-9073   °[facility]  336-845-3988   °Residential & Outpatient Substance Abuse Program  1-800-659-3381   °Psychological Services °Organization         Address  Phone  Notes  ° Health  336- [phone]   °[facility]  336- 378-7881   °[facility] [address], [place] 1-800-853-5163 or 336-641-4981   ° °Mobile Crisis  Teams °Organization         Address  Phone  Notes  °[facility], Mobile Crisis Care Unit  1-877-626-1772   °Assertive °Psychotherapeutic Services ° [address] [place], [place] 336-834-9664   °Sharon DeEsch [address] °[place] 336-554-5454   ° °Self-Help/Support Groups °Organization         Address  Phone             Notes  °[facility] [place] -   variety of support groups  336- 336-389-6044 Call for more information  Narcotics Anonymous (NA), [facility] [address], [employer] [place]  2 meetings at this location   Residential Facilities manager         Address  Phone  Notes  [facility] [place],    [place]  [phone]   [facility]  [address] [place] [address], [place], [place]   [place], [place] [phone] Admissions: 8am-3pm M-F  [facility] [place] [address],    [place], [place] [phone]   [facility] [address] [place], [place], [place]   [facility] [address]  [place], [place]   [facility] - Intensive Outpatient [address], [place], [place]   [facility] [place] [address] [address]  [place], [place] 1-640-386-3300 or [phone]   [facility] ([facility]) [address] [place], [place] Accepts Medicaid  [facility] [place] [address],  [place] 1-646-193-9807 Substance Abuse/Addiction Treatment   [facility] Organization         Address  Phone  Notes  [facility]  [phone]   Domenic Schwab, PhD [address] [place], [place]   ([phone]) [phone] or [phone]   [facility], [place] [phone]   [facility] [address] [place], [place] [phone]  Insurance/Medicaid/sponsorship through [facility] and Families [address]                                    [place], [place] [phone] [place] [address], [place] [phone]    Dr. Adele Schilder  [phone]   [facility] Dept. 1) [address] [place] 2) [place] 3)  [place] [address], [place] [phone]  [phone]   [place] [phone] or [phone] (After Hours)

## 2014-05-24 NOTE — ED Provider Notes (Signed)
CSN: [id number]     Arrival date & time 05/24/14  1353 History  This chart was scribed for non-physician practitioner, Sharilyn Sites, PA-C, working with Linwood Dibbles, MD by Charline Bills, ED Scribe. This patient was seen in room WTR5/WTR5 and the patient's care was started at 2:09 PM.   Chief Complaint  Patient presents with  . Dental Pain   The history is provided by the patient. No language interpreter was used.   HPI Comments: Rickey Becker is a 24 y.o. male, with a h/o asthma, arthritis, periodic fever syndrome, who presents to the Emergency Department complaining of gradually worsening, constant L lower dental pain for the past 2 days. Pain is exacerbated with touching and eating. He reports associated facial swelling and gum swelling. No fever/chills.  No difficulty swallowing or SOB.  Pt has been treating with topical gel, Tylenol Extra Strength and ibuprofen. Allergy to Codeine.   Past Medical History  Diagnosis Date  . Periodic fever syndrome   . Juvenile arthritis   . Asthma    Past Surgical History  Procedure Laterality Date  . External ear surgery    . Inner ear surgery     History reviewed. No pertinent family history. History  Substance Use Topics  . Smoking status: Former Smoker    Types: Cigarettes  . Smokeless tobacco: Never Used  . Alcohol Use: No    Review of Systems  HENT: Positive for dental problem and facial swelling.   All other systems reviewed and are negative.  Allergies  Codeine  Home Medications   Prior to Admission medications   Medication Sig Start Date End Date Taking? Authorizing Provider  acetaminophen (TYLENOL) 500 MG tablet Take 500 mg by mouth every 6 (six) hours as needed for mild pain or headache.     Historical Provider, MD  albuterol (PROVENTIL HFA;VENTOLIN HFA) 108 (90 BASE) MCG/ACT inhaler Inhale 2 puffs into the lungs every 4 (four) hours as needed for wheezing or shortness of breath (cough). Breathing 05/04/13   Jennifer L  Piepenbrink, PA-C  Aspirin-Acetaminophen-Caffeine (GOODY HEADACHE PO) Take 1 packet by mouth every 6 (six) hours as needed (for headache).    Historical Provider, MD  ibuprofen (ADVIL,MOTRIN) 200 MG tablet Take 200 mg by mouth every 6 (six) hours as needed for pain.     Historical Provider, MD  Nasal Moisturizer Combination (LITTLE REMEDIES FOR NOSES) SOLN Place 1 spray into the nose 3 (three) times daily as needed (for nasal congestion).    Historical Provider, MD  ondansetron (ZOFRAN) 4 MG tablet Take 1 tablet (4 mg total) by mouth every 6 (six) hours. 03/06/14   Tiffany Irine Seal, PA-C   BP 142/81 mmHg  Pulse 98  Temp(Src) 98.6 F (37 C) (Oral)  Resp 16  SpO2 97% Physical Exam  Constitutional: He is oriented to person, place, and time. He appears well-developed and well-nourished.  HENT:  Head: Normocephalic and atraumatic.  Mouth/Throat: Oropharynx is clear and moist.  Teeth largely in fair dentition, right lower molar with obvious dental caries, surrounding gingiva swollen, erythematous with signs of dental abscess, handling secretions appropriately, no trismus; mild swelling of right cheek without extension into neck; normal phonation, no stridor  Eyes: Conjunctivae and EOM are normal. Pupils are equal, round, and reactive to light.  Neck: Trachea normal, normal range of motion, full passive range of motion without pain and phonation normal. Neck supple.  Cardiovascular: Normal rate, regular rhythm and normal heart sounds.   Pulmonary/Chest: Effort normal and  breath sounds normal. No stridor.  Abdominal: Soft. Bowel sounds are normal.  Musculoskeletal: Normal range of motion.  Neurological: He is alert and oriented to person, place, and time.  Skin: Skin is warm and dry.  Psychiatric: He has a normal mood and affect.  Nursing note and vitals reviewed.  ED Course  Procedures (including critical care time) DIAGNOSTIC STUDIES: Oxygen Saturation is 97% on RA, normal by my  interpretation.    COORDINATION OF CARE: 2:11 PM-Discussed treatment plan which includes abx, pain meds, follow-up with dentist with pt at bedside and pt agreed to plan.   Labs Review Labs Reviewed - No data to display  Imaging Review No results found.   EKG Interpretation None      MDM   Final diagnoses:  Dental abscess  Pain, dental   Dental pain with signs of dental abscess. Patient has mild swelling of his right cheek without extension into neck. He is handling secretions well, no difficulty swallowing or speaking. No concern for Ludwig's angina. He'll be discharged home with pain medication and antibiotics. He is instructed to follow-up with dentist, referrals and resource guide provided.  Discussed plan with patient, he/she acknowledged understanding and agreed with plan of care.  Return precautions given for new or worsening symptoms.  I personally performed the services described in this documentation, which was scribed in my presence. The recorded information has been reviewed and is accurate.    Garlon HatchetLisa M Eather Chaires, PA-C 05/24/14 1418  Linwood DibblesJon Knapp, MD 05/24/14 1640

## 2014-05-24 NOTE — ED Notes (Signed)
Pt states dental pain x 2 days. Facial swelling and gum swelling.

## 2014-08-16 ENCOUNTER — Encounter (HOSPITAL_COMMUNITY): Payer: Self-pay | Admitting: Emergency Medicine

## 2014-08-16 ENCOUNTER — Emergency Department (HOSPITAL_COMMUNITY)
Admission: EM | Admit: 2014-08-16 | Discharge: 2014-08-16 | Disposition: A | Discharge status: Home / Self Care | Payer: Medicare Other | Attending: Emergency Medicine | Admitting: Emergency Medicine

## 2014-08-16 DIAGNOSIS — M0890 Juvenile arthritis, unspecified, unspecified site: Secondary | ICD-10-CM | POA: Diagnosis not present

## 2014-08-16 DIAGNOSIS — Z87891 Personal history of nicotine dependence: Secondary | ICD-10-CM | POA: Diagnosis not present

## 2014-08-16 DIAGNOSIS — Y998 Other external cause status: Secondary | ICD-10-CM | POA: Insufficient documentation

## 2014-08-16 DIAGNOSIS — Y9389 Activity, other specified: Secondary | ICD-10-CM | POA: Insufficient documentation

## 2014-08-16 DIAGNOSIS — W57XXXA Bitten or stung by nonvenomous insect and other nonvenomous arthropods, initial encounter: Secondary | ICD-10-CM | POA: Insufficient documentation

## 2014-08-16 DIAGNOSIS — Y92310 Basketball court as the place of occurrence of the external cause: Secondary | ICD-10-CM | POA: Diagnosis not present

## 2014-08-16 DIAGNOSIS — S60561A Insect bite (nonvenomous) of right hand, initial encounter: Secondary | ICD-10-CM | POA: Diagnosis not present

## 2014-08-16 DIAGNOSIS — Z792 Long term (current) use of antibiotics: Secondary | ICD-10-CM | POA: Insufficient documentation

## 2014-08-16 DIAGNOSIS — Z79899 Other long term (current) drug therapy: Secondary | ICD-10-CM | POA: Diagnosis not present

## 2014-08-16 DIAGNOSIS — Z8639 Personal history of other endocrine, nutritional and metabolic disease: Secondary | ICD-10-CM | POA: Diagnosis not present

## 2014-08-16 DIAGNOSIS — T63481A Toxic effect of venom of other arthropod, accidental (unintentional), initial encounter: Secondary | ICD-10-CM

## 2014-08-16 DIAGNOSIS — Y9367 Activity, basketball: Secondary | ICD-10-CM | POA: Diagnosis not present

## 2014-08-16 DIAGNOSIS — J45901 Unspecified asthma with (acute) exacerbation: Secondary | ICD-10-CM | POA: Insufficient documentation

## 2014-08-16 MED ORDER — TRAMADOL-ACETAMINOPHEN 37.5-325 MG PO TABS: 1.0000 | ORAL_TABLET | Freq: Four times a day (QID) | ORAL | Status: DC | PRN

## 2014-08-16 MED ORDER — ALBUTEROL SULFATE HFA 108 (90 BASE) MCG/ACT IN AERS
2.0000 | INHALATION_SPRAY | Freq: Once | RESPIRATORY_TRACT | Status: AC
  Administered 2014-08-16: 2 via RESPIRATORY_TRACT
  Filled 2014-08-16: qty 6.7

## 2014-08-16 NOTE — ED Provider Notes (Signed)
CSN: [id number]     Arrival date & time 08/16/14  1217 History   First MD Initiated Contact with Patient 08/16/14 1223     Chief Complaint  Patient presents with  . Insect Bite  . Hand Injury   The history is provided by the patient. No language interpreter was used.   This chart was scribed for non-physician practitioner Trixie Dredge, PA-C, working with Eber Hong, MD, by Andrew Au, ED Scribe. This patient was seen in room WTR9/WTR9 and the patient's care was started at 12:27 PM.  Rickey Becker is a 24 y.o. male who presents to the Emergency Department complaining of an improving insect bite. Pt has hx of juvenile arthritis and take ibuprofen daily. He reports while playing basketball yesterday when he noticed sudden pain and swelling to right hand that he initially believed to be arthritis symptoms. Pt now present with improving sharp painful area with swelling to right hand.  The swelling and redness has improved since last night.  Pt has had a productive cough with dark green mucous for the past 2 days. He also has had some wheezing and reports hx of asthma. Pt last used inhaler 2 days ago.  Pt denies fever, chills, nausea, emesis, CP, SOB.  Denies any injury while playing basketball yesterday or any known injury in the past few days.    Past Medical History  Diagnosis Date  . Periodic fever syndrome   . Juvenile arthritis   . Asthma    Past Surgical History  Procedure Laterality Date  . External ear surgery    . Inner ear surgery     No family history on file. History  Substance Use Topics  . Smoking status: Former Smoker    Types: Cigarettes  . Smokeless tobacco: Never Used  . Alcohol Use: No    Review of Systems  Constitutional: Negative for fever and chills.  HENT: Negative for congestion and sore throat.   Respiratory: Positive for cough and wheezing. Negative for shortness of breath.   Cardiovascular: Negative for chest pain.  Gastrointestinal: Negative for nausea  and vomiting.  Musculoskeletal: Negative for myalgias and arthralgias.  Skin: Positive for wound.  Allergic/Immunologic: Negative for immunocompromised state.  Neurological: Negative for weakness and numbness.  Hematological: Does not bruise/bleed easily.  Psychiatric/Behavioral: Negative for self-injury.      Allergies  Codeine  Home Medications   Prior to Admission medications   Medication Sig Start Date End Date Taking? Authorizing Provider  acetaminophen (TYLENOL) 500 MG tablet Take 500 mg by mouth every 6 (six) hours as needed for mild pain or headache.     Historical Provider, MD  albuterol (PROVENTIL HFA;VENTOLIN HFA) 108 (90 BASE) MCG/ACT inhaler Inhale 2 puffs into the lungs every 4 (four) hours as needed for wheezing or shortness of breath (cough). Breathing 05/04/13   Jennifer Piepenbrink, PA-C  Aspirin-Acetaminophen-Caffeine (GOODY HEADACHE PO) Take 1 packet by mouth every 6 (six) hours as needed (for headache).    Historical Provider, MD  ibuprofen (ADVIL,MOTRIN) 200 MG tablet Take 200 mg by mouth every 6 (six) hours as needed for pain.     Historical Provider, MD  Nasal Moisturizer Combination (LITTLE REMEDIES FOR NOSES) SOLN Place 1 spray into the nose 3 (three) times daily as needed (for nasal congestion).    Historical Provider, MD  ondansetron (ZOFRAN) 4 MG tablet Take 1 tablet (4 mg total) by mouth every 6 (six) hours. 03/06/14   Marlon Pel, PA-C  penicillin v potassium (VEETID)  500 MG tablet Take 1 tablet (500 mg total) by mouth 4 (four) times daily. 05/24/14   Garlon HatchetLisa M Sanders, PA-C  traMADol-acetaminophen (ULTRACET) 37.5-325 MG per tablet Take 1 tablet by mouth every 6 (six) hours as needed. 05/24/14   Garlon HatchetLisa M Sanders, PA-C   BP 116/43 mmHg  Pulse 99  Temp(Src) 97.8 F (36.6 C) (Oral)  Resp 18  SpO2 100% Physical Exam  Constitutional: He appears well-developed and well-nourished. No distress.  HENT:  Head: Normocephalic and atraumatic.  Neck: Neck supple.   Cardiovascular: Normal rate, regular rhythm, normal heart sounds and intact distal pulses.  Exam reveals no friction rub.   Pulmonary/Chest: Effort normal. No respiratory distress. He has wheezes ( mild). He has no rales. He exhibits no tenderness.  Musculoskeletal:  Right hand:  Full ROM of all digits and wrist. Sensation intact all throughout   Neurological: He is alert.  Skin: He is not diaphoretic.  Right hand: Erythema and edema over 4th metacarpal with very mild soft tissue tenderness, no focal bony tenderness. No tenderness to joints, and specifically none over 4th MCP. Small scab overlying effected area consistent with insect bite or sting.  No fluctuance or induration.    Psychiatric: He has a normal mood and affect. His behavior is normal.  Nursing note and vitals reviewed.   ED Course  Procedures (including critical care time) DIAGNOSTIC STUDIES: Oxygen Saturation is 100% on RA, normal by my interpretation.    COORDINATION OF CARE: 12:55 PM- Pt advised of plan for treatment and pt agrees.  Labs Review Labs Reviewed - No data to display  Imaging Review No results found.   EKG Interpretation None      MDM   Final diagnoses:  Bites and stings, insect, accidental or unintentional, initial encounter    Afebrile, nontoxic patient with likely insect bite or sting to the dorsal right hand.  No joint tenderness or involvement.  Doubt rheumatoid flair or septic joint.  No injury.  The course is improving.  Neurovascularly intact.  Doubt systemic allergic reaction.  Pt has mild wheezing likely due to asthma and current mild viral syndrome.  O2 is 100%.  Only mild wheezes on pulmonary exam.  No airway concerns.  As course is improving and this appears to be related to sting/bite, doubt cellulitis.   D/C home with pain medication, PCP follow up.  Discussed strict return precautions.  Discussed result, findings, treatment, and follow up  with patient.  Pt given return precautions.   Pt verbalizes understanding and agrees with plan.       I personally performed the services described in this documentation, which was scribed in my presence. The recorded information has been reviewed and is accurate.    Trixie Dredgemily Parnika Tweten, PA-C 08/16/14 1328  Eber HongBrian Miller, MD 08/17/14 [phone]

## 2014-08-16 NOTE — Discharge Instructions (Signed)
Read the information below.  Use the prescribed medication as directed.  Please discuss all new medications with your pharmacist.  You may return to the Emergency Department at any time for worsening condition or any new symptoms that concern you.  If you develop increased redness, swelling, difficulty moving your fingers, pus draining from the wound, or fevers greater than 100.4, return to the ER immediately for a recheck.     Bee, Wasp, or Hornet Sting Your caregiver has diagnosed you as having an insect sting. An insect sting appears as a red lump in the skin that sometimes has a tiny hole in the center, or it may have a stinger in the center of the wound. The most common stings are from wasps, hornets and bees. Individuals have different reactions to insect stings.  A normal reaction may cause pain, swelling, and redness around the sting site.  A localized allergic reaction may cause swelling and redness that extends beyond the sting site.  A large local reaction may continue to develop over the next 12 to 36 hours.  On occasion, the reactions can be severe (anaphylactic reaction). An anaphylactic reaction may cause wheezing; difficulty breathing; chest pain; fainting; raised, itchy, red patches on the skin; a sick feeling to your stomach (nausea); vomiting; cramping; or diarrhea. If you have had an anaphylactic reaction to an insect sting in the past, you are more likely to have one again. HOME CARE INSTRUCTIONS   With bee stings, a small sac of poison is left in the wound. Brushing across this with something such as a credit card, or anything similar, will help remove this and decrease the amount of the reaction. This same procedure will not help a wasp sting as they do not leave behind a stinger and poison sac.  Apply a cold compress for 10 to 20 minutes every hour for 1 to 2 days, depending on severity, to reduce swelling and itching.  To lessen pain, a paste made of water and baking soda  may be rubbed on the bite or sting and left on for 5 minutes.  To relieve itching and swelling, you may use take medication or apply medicated creams or lotions as directed.  Only take over-the-counter or prescription medicines for pain, discomfort, or fever as directed by your caregiver.  Wash the sting site daily with soap and water. Apply antibiotic ointment on the sting site as directed.  If you suffered a severe reaction:  If you did not require hospitalization, an adult will need to stay with you for 24 hours in case the symptoms return.  You may need to wear a medical bracelet or necklace stating the allergy.  You and your family need to learn when and how to use an anaphylaxis kit or epinephrine injection.  If you have had a severe reaction before, always carry your anaphylaxis kit with you. SEEK MEDICAL CARE IF:   None of the above helps within 2 to 3 days.  The area becomes red, warm, tender, and swollen beyond the area of the bite or sting.  You have an oral temperature above 102 F (38.9 C). SEEK IMMEDIATE MEDICAL CARE IF:  You have symptoms of an allergic reaction which are:  Wheezing.  Difficulty breathing.  Chest pain.  Lightheadedness or fainting.  Itchy, raised, red patches on the skin.  Nausea, vomiting, cramping or diarrhea. ANY OF THESE SYMPTOMS MAY REPRESENT A SERIOUS PROBLEM THAT IS AN EMERGENCY. Do not wait to see if the symptoms will  go away. Get medical help right away. Call your local emergency services (911 in [place] DO NOT drive yourself to the hospital. MAKE SURE YOU:   Understand these instructions.  Will watch your condition.  Will get help right away if you are not doing well or get worse. Document Released: 04/07/2005 Document Revised: 06/30/2011 Document Reviewed: 09/22/2009 [facility] Patient Information 2015 [place], [place]. This information is not intended to replace advice given to you by your health care provider. Make sure you  discuss any questions you have with your health care provider.

## 2014-08-16 NOTE — ED Notes (Signed)
Pt states he was playing basketball yesterday and felt a pinch to the back of his right hand. States he has juvenile arthritis and thought he may have jammed his finger. Woke up this morning with a sharp pain in his hand and swelling/redness to the back of his hand.

## 2014-10-02 ENCOUNTER — Encounter (HOSPITAL_COMMUNITY): Payer: Self-pay

## 2014-10-02 ENCOUNTER — Emergency Department (HOSPITAL_COMMUNITY)
Admission: EM | Admit: 2014-10-02 | Discharge: 2014-10-02 | Disposition: A | Discharge status: Home / Self Care | Payer: Medicare Other | Attending: Emergency Medicine | Admitting: Emergency Medicine

## 2014-10-02 DIAGNOSIS — Z79899 Other long term (current) drug therapy: Secondary | ICD-10-CM | POA: Diagnosis not present

## 2014-10-02 DIAGNOSIS — J029 Acute pharyngitis, unspecified: Secondary | ICD-10-CM | POA: Diagnosis present

## 2014-10-02 DIAGNOSIS — Z87891 Personal history of nicotine dependence: Secondary | ICD-10-CM | POA: Insufficient documentation

## 2014-10-02 DIAGNOSIS — J45909 Unspecified asthma, uncomplicated: Secondary | ICD-10-CM | POA: Insufficient documentation

## 2014-10-02 DIAGNOSIS — Z792 Long term (current) use of antibiotics: Secondary | ICD-10-CM | POA: Diagnosis not present

## 2014-10-02 DIAGNOSIS — M1990 Unspecified osteoarthritis, unspecified site: Secondary | ICD-10-CM | POA: Diagnosis not present

## 2014-10-02 NOTE — Discharge Instructions (Signed)
Salt Water Gargle °This solution will help make your mouth and throat feel better. °HOME CARE INSTRUCTIONS  °· Mix 1 teaspoon of salt in 8 ounces of warm water. °· Gargle with this solution as much or often as you need or as directed. Swish and gargle gently if you have any sores or wounds in your mouth. °· Do not swallow this mixture. °Document Released: 01/10/2004 Document Revised: 06/30/2011 Document Reviewed: 06/02/2008 °ExitCare® Patient Information ©2015 [employer]. This information is not intended to replace advice given to you by your health care provider. Make sure you discuss any questions you have with your health care provider. °Pharyngitis °Pharyngitis is redness, pain, and swelling (inflammation) of your pharynx.  °CAUSES  °Pharyngitis is usually caused by infection. Most of the time, these infections are from viruses (viral) and are part of a cold. However, sometimes pharyngitis is caused by bacteria (bacterial). Pharyngitis can also be caused by allergies. Viral pharyngitis may be spread from person to person by coughing, sneezing, and personal items or utensils (cups, forks, spoons, toothbrushes). Bacterial pharyngitis may be spread from person to person by more intimate contact, such as kissing.  °SIGNS AND SYMPTOMS  °Symptoms of pharyngitis include:   °· Sore throat.   °· Tiredness (fatigue).   °· Low-grade fever.   °· Headache. °· Joint pain and muscle aches. °· Skin rashes. °· Swollen lymph nodes. °· Plaque-like film on throat or tonsils (often seen with bacterial pharyngitis). °DIAGNOSIS  °Your health care provider will ask you questions about your illness and your symptoms. Your medical history, along with a physical exam, is often all that is needed to diagnose pharyngitis. Sometimes, a rapid strep test is done. Other lab tests may also be done, depending on the suspected cause.  °TREATMENT  °Viral pharyngitis will usually get better in 3-4 days without the use of medicine. Bacterial  pharyngitis is treated with medicines that kill germs (antibiotics).  °HOME CARE INSTRUCTIONS  °· Drink enough water and fluids to keep your urine clear or pale yellow.   °· Only take over-the-counter or prescription medicines as directed by your health care provider:   °¨ If you are prescribed antibiotics, make sure you finish them even if you start to feel better.   °¨ Do not take aspirin.   °· Get lots of rest.   °· Gargle with 8 oz of salt water (½ tsp of salt per 1 qt of water) as often as every 1-2 hours to soothe your throat.   °· Throat lozenges (if you are not at risk for choking) or sprays may be used to soothe your throat. °SEEK MEDICAL CARE IF:  °· You have large, tender lumps in your neck. °· You have a rash. °· You cough up green, yellow-brown, or bloody spit. °SEEK IMMEDIATE MEDICAL CARE IF:  °· Your neck becomes stiff. °· You drool or are unable to swallow liquids. °· You vomit or are unable to keep medicines or liquids down. °· You have severe pain that does not go away with the use of recommended medicines. °· You have trouble breathing (not caused by a stuffy nose). °MAKE SURE YOU:  °· Understand these instructions. °· Will watch your condition. °· Will get help right away if you are not doing well or get worse. °Document Released: 04/07/2005 Document Revised: 01/26/2013 Document Reviewed: 12/13/2012 °ExitCare® Patient Information ©2015 [employer]. This information is not intended to replace advice given to you by your health care provider. Make sure you discuss any questions you have with your health care provider. ° °

## 2014-10-02 NOTE — ED Provider Notes (Signed)
CSN: [id number]     Arrival date & time 10/02/14  2138 History  This chart was scribed for non-physician provider Elpidio Anis, PA-C, working with Lorre Nick, MD by [name], ED Scribe. This patient was seen in room WTR8/WTR8 and patient care was started at 11:11 PM.    Chief Complaint  Patient presents with  . Sore Throat   The history is provided by the patient. No language interpreter was used.  HPI Comments: Rickey Becker is a 24 y.o. male who presents to the Emergency Department complaining of sore throat onset 3 days ago. He states that he has a pea-sized knot on the inside of the right side of his throat that hurts when he swallows. He reports taking Tylenol and Motrin to no relief. He denies fever, appetite change, cough, or sick contacts. Pt states that he smokes when he is stressed.   Past Medical History  Diagnosis Date  . Periodic fever syndrome   . Juvenile arthritis   . Asthma    Past Surgical History  Procedure Laterality Date  . External ear surgery    . Inner ear surgery     No family history on file. History  Substance Use Topics  . Smoking status: Former Smoker    Types: Cigarettes  . Smokeless tobacco: Never Used  . Alcohol Use: No    Review of Systems  Constitutional: Negative for fever and appetite change.  HENT: Positive for sore throat. Negative for trouble swallowing.   Respiratory: Negative for cough.    Allergies  Codeine  Home Medications   Prior to Admission medications   Medication Sig Start Date End Date Taking? Authorizing Provider  acetaminophen (TYLENOL) 500 MG tablet Take 500 mg by mouth every 6 (six) hours as needed for mild pain or headache.     Historical Provider, MD  albuterol (PROVENTIL HFA;VENTOLIN HFA) 108 (90 BASE) MCG/ACT inhaler Inhale 2 puffs into the lungs every 4 (four) hours as needed for wheezing or shortness of breath (cough). Breathing 05/04/13   Jennifer Piepenbrink, PA-C  Aspirin-Acetaminophen-Caffeine  (GOODY HEADACHE PO) Take 1 packet by mouth every 6 (six) hours as needed (for headache).    Historical Provider, MD  ibuprofen (ADVIL,MOTRIN) 200 MG tablet Take 200 mg by mouth every 6 (six) hours as needed for pain.     Historical Provider, MD  Nasal Moisturizer Combination (LITTLE REMEDIES FOR NOSES) SOLN Place 1 spray into the nose 3 (three) times daily as needed (for nasal congestion).    Historical Provider, MD  ondansetron (ZOFRAN) 4 MG tablet Take 1 tablet (4 mg total) by mouth every 6 (six) hours. 03/06/14   Marlon Pel, PA-C  penicillin v potassium (VEETID) 500 MG tablet Take 1 tablet (500 mg total) by mouth 4 (four) times daily. 05/24/14   Garlon Hatchet, PA-C  traMADol-acetaminophen (ULTRACET) 37.5-325 MG per tablet Take 1 tablet by mouth every 6 (six) hours as needed for severe pain. 08/16/14   Trixie Dredge, PA-C   BP 133/76 mmHg  Pulse 69  Temp(Src) 98.2 F (36.8 C) (Oral)  Resp 16  SpO2 100%   Physical Exam  Constitutional: He is oriented to person, place, and time. He appears well-developed and well-nourished. No distress.  HENT:  Head: Normocephalic and atraumatic.  Mouth/Throat: Uvula is midline and oropharynx is clear and moist.  Mild swelling and erythema of uvula and oropharynx; no tonsillar swelling or exudates; good dentition, no adenopathy  Eyes: Conjunctivae and EOM are normal.  Neck: Normal  range of motion. Neck supple.  Cardiovascular: Normal rate, regular rhythm and normal heart sounds.   Pulmonary/Chest: Effort normal and breath sounds normal.  Musculoskeletal: Normal range of motion. He exhibits no edema.  Neurological: He is alert and oriented to person, place, and time.  Skin: Skin is warm and dry.  Psychiatric: He has a normal mood and affect. His behavior is normal.  Nursing note and vitals reviewed.   ED Course  Procedures (including critical care time) DIAGNOSTIC STUDIES: Oxygen Saturation is 100% on RA, normal by my interpretation.    COORDINATION  OF CARE: 11:13 PM-Discussed treatment plan which includes tylenol and motrin with lots of fluids with pt at bedside and pt agreed to plan.   Labs Review Labs Reviewed - No data to display  Imaging Review No results found.   EKG Interpretation None      MDM   Final diagnoses:  None    1. Pharyngitis  The patient's concern was that his sore throat was caused by cancer as he is a smoker. Reassurance offered, smoking cessation encouraged. Symptoms/exam support viral process requiring supportive care.  I personally performed the services described in this documentation, which was scribed in my presence. The recorded information has been reviewed and is accurate.     Elpidio Anis, PA-C 10/03/14 0445  Lorre Nick, MD 10/06/14 [phone]

## 2014-10-02 NOTE — ED Notes (Signed)
Pt presents with c/o right side sore throat. Pt reports he feels like he has a pea-sized knot on the inside of his throat on that right side. Pt reports he feels the pain when swallowing.

## 2015-01-10 ENCOUNTER — Emergency Department (HOSPITAL_COMMUNITY)
Admission: EM | Admit: 2015-01-10 | Discharge: 2015-01-10 | Disposition: A | Discharge status: Home / Self Care | Payer: Medicare Other | Attending: Emergency Medicine | Admitting: Emergency Medicine

## 2015-01-10 DIAGNOSIS — K0530 Chronic periodontitis, unspecified: Secondary | ICD-10-CM | POA: Diagnosis not present

## 2015-01-10 DIAGNOSIS — K088 Other specified disorders of teeth and supporting structures: Secondary | ICD-10-CM | POA: Diagnosis present

## 2015-01-10 DIAGNOSIS — K002 Abnormalities of size and form of teeth: Secondary | ICD-10-CM | POA: Diagnosis not present

## 2015-01-10 DIAGNOSIS — R509 Fever, unspecified: Secondary | ICD-10-CM | POA: Diagnosis not present

## 2015-01-10 DIAGNOSIS — Z8639 Personal history of other endocrine, nutritional and metabolic disease: Secondary | ICD-10-CM | POA: Insufficient documentation

## 2015-01-10 DIAGNOSIS — K029 Dental caries, unspecified: Secondary | ICD-10-CM | POA: Insufficient documentation

## 2015-01-10 DIAGNOSIS — M0890 Juvenile arthritis, unspecified, unspecified site: Secondary | ICD-10-CM | POA: Diagnosis not present

## 2015-01-10 DIAGNOSIS — J45909 Unspecified asthma, uncomplicated: Secondary | ICD-10-CM | POA: Insufficient documentation

## 2015-01-10 DIAGNOSIS — Z87891 Personal history of nicotine dependence: Secondary | ICD-10-CM | POA: Diagnosis not present

## 2015-01-10 DIAGNOSIS — R51 Headache: Secondary | ICD-10-CM | POA: Diagnosis not present

## 2015-01-10 MED ORDER — IBUPROFEN 200 MG PO TABS
600.0000 mg | ORAL_TABLET | Freq: Once | ORAL | Status: AC
  Administered 2015-01-10: 600 mg via ORAL
  Filled 2015-01-10: qty 3

## 2015-01-10 MED ORDER — PENICILLIN V POTASSIUM 250 MG PO TABS: 250.0000 mg | ORAL_TABLET | Freq: Four times a day (QID) | ORAL | Status: AC

## 2015-01-10 MED ORDER — HYDROCODONE-ACETAMINOPHEN 5-325 MG PO TABS
1.0000 | ORAL_TABLET | Freq: Once | ORAL | Status: AC
  Administered 2015-01-10: 1 via ORAL
  Filled 2015-01-10: qty 1

## 2015-01-10 MED ORDER — IBUPROFEN 600 MG PO TABS: 600.0000 mg | ORAL_TABLET | Freq: Four times a day (QID) | ORAL | Status: DC | PRN

## 2015-01-10 MED ORDER — HYDROCODONE-ACETAMINOPHEN 5-325 MG PO TABS
1.0000 | ORAL_TABLET | Freq: Four times a day (QID) | ORAL | Status: DC | PRN
Start: 2015-01-10 — End: 2015-12-05

## 2015-01-10 NOTE — ED Provider Notes (Signed)
CSN: [id number]     Arrival date & time 01/10/15  7829 History   First MD Initiated Contact with Patient 01/10/15 1212     Chief Complaint  Patient presents with  . Dental Pain     (Consider location/radiation/quality/duration/timing/severity/associated sxs/prior Treatment) HPI Comments: Pt comes in with cc of dental pain. Pt has no medical hx. He reports that he started having toothache 2 days ago. His pain got severe today. He also reports that the pain today is radiating to the ear and down his jaw. He has no ringing in his ear. He also has frontal headaches. Pain is his tooth is severe, the headache is mild to moderate. Pt has no photophobia with the headache, and he denies neck pain or stiffness. Pt has no hx of dentist visit. He is not drooling. Mother at bedside and reports no ams, seizures, confusion. Pt had subjective fevers. He also admits to some sweats.   Patient is a 24 y.o. male presenting with tooth pain. The history is provided by the patient.  Dental Pain Associated symptoms: fever and headaches   Associated symptoms: no drooling and no facial swelling     Past Medical History  Diagnosis Date  . Periodic fever syndrome   . Juvenile arthritis   . Asthma    Past Surgical History  Procedure Laterality Date  . External ear surgery    . Inner ear surgery     No family history on file. Social History  Substance Use Topics  . Smoking status: Former Smoker    Types: Cigarettes  . Smokeless tobacco: Never Used  . Alcohol Use: No    Review of Systems  Constitutional: Positive for fever and activity change. Negative for appetite change.  HENT: Positive for dental problem. Negative for drooling, ear pain, facial swelling, hearing loss, tinnitus, trouble swallowing and voice change.   Eyes: Negative for photophobia and visual disturbance.  Respiratory: Negative for shortness of breath.   Cardiovascular: Negative for chest pain.  Gastrointestinal: Negative for  abdominal pain.  Musculoskeletal: Negative for neck stiffness.  Skin: Negative for rash.  Allergic/Immunologic: Negative for immunocompromised state.  Neurological: Positive for headaches. Negative for tremors, seizures, speech difficulty, weakness and numbness.  Psychiatric/Behavioral: Negative for confusion.  All other systems reviewed and are negative.     Allergies  Codeine  Home Medications   Prior to Admission medications   Medication Sig Start Date End Date Taking? Authorizing Provider  acetaminophen (TYLENOL) 500 MG tablet Take 1,000 mg by mouth every 6 (six) hours as needed for mild pain or headache.    Yes Historical Provider, MD  Aspirin-Acetaminophen-Caffeine (GOODY HEADACHE PO) Take 1 packet by mouth every 6 (six) hours as needed (for headache).   Yes Historical Provider, MD  albuterol (PROVENTIL HFA;VENTOLIN HFA) 108 (90 BASE) MCG/ACT inhaler Inhale 2 puffs into the lungs every 4 (four) hours as needed for wheezing or shortness of breath (cough). Breathing Patient not taking: Reported on 01/10/2015 05/04/13   Francee Piccolo, PA-C  HYDROcodone-acetaminophen (NORCO/VICODIN) 5-325 MG per tablet Take 1 tablet by mouth every 6 (six) hours as needed. 01/10/15   Derwood Kaplan, MD  ibuprofen (ADVIL,MOTRIN) 600 MG tablet Take 1 tablet (600 mg total) by mouth every 6 (six) hours as needed. 01/10/15   Derwood Kaplan, MD  ondansetron (ZOFRAN) 4 MG tablet Take 1 tablet (4 mg total) by mouth every 6 (six) hours. Patient not taking: Reported on 10/02/2014 03/06/14   Marlon Pel, PA-C  penicillin v potassium (VEETID)  250 MG tablet Take 1 tablet (250 mg total) by mouth 4 (four) times daily. 01/10/15 01/17/15  Derwood Kaplan, MD  traMADol-acetaminophen (ULTRACET) 37.5-325 MG per tablet Take 1 tablet by mouth every 6 (six) hours as needed for severe pain. Patient not taking: Reported on 10/02/2014 08/16/14   Trixie Dredge, PA-C   BP 101/55 mmHg  Pulse 68  Temp(Src) 99.2 F (37.3 C) (Oral)   Resp 17  SpO2 96% Physical Exam  Constitutional: He is oriented to person, place, and time. He appears well-developed and well-nourished.  HENT:  Head: Normocephalic and atraumatic.  Mouth/Throat: Oropharynx is clear and moist and mucous membranes are normal. Mucous membranes are not pale, not dry and not cyanotic. He does not have dentures. No oral lesions. No trismus in the jaw. Abnormal dentition. Dental caries present. No uvula swelling. No oropharyngeal exudate or tonsillar abscesses.    No gingival swelling/fluctuance. No mastoid tenderness.  Eyes: EOM are normal. Pupils are equal, round, and reactive to light.  Neck: Normal range of motion. Neck supple. No JVD present.  No nuchal rigidity  Cardiovascular: Normal rate and regular rhythm.   Pulmonary/Chest: Effort normal and breath sounds normal. No respiratory distress. He has no wheezes.  Abdominal: Soft. Bowel sounds are normal. He exhibits no distension. There is no tenderness. There is no rebound and no guarding.  Lymphadenopathy:    He has cervical adenopathy.  Neurological: He is alert and oriented to person, place, and time. No cranial nerve deficit. Coordination normal.  NIHSS - 0 No objective sensory deficits, Motor strength upper and lower extremity 4+ and equal Normal cerebellar exam  Skin: Skin is warm and dry.  Nursing note reviewed.   ED Course  Procedures (including critical care time) Labs Review Labs Reviewed - No data to display  Imaging Review No results found. I have personally reviewed and evaluated these images and lab results as part of my medical decision-making.   EKG Interpretation None      MDM   Final diagnoses:  Periodontitis    Pt comes in with toothache.  He reports fevers, chills, sweats.  No trismus, no meningeal signs, no drooling. Pt is not toxic. No tenderness over the mastoid process. There is no focal neuro deficits. There is no abscess for me to drain.  - suspect  periodontal infection and periapical abscess. - Pain meds and antibiotics given. - mother and pt advised on return precautions verbally, and mother to check on son every 2-4 hours.  - Advised that it is critical for him to see the dentist soon. Dr. Lawrence Marseilles info given, and Dr. Lucky Cowboy info given. - Er return precautions verified.    Derwood Kaplan, MD 01/10/15 [phone]

## 2015-01-10 NOTE — ED Notes (Signed)
Reports headaches and fevers with the dental pain. Last took any OTC meds yesterday around 7pm.

## 2015-01-10 NOTE — Discharge Instructions (Signed)
We saw you in the ER for the toothache. No evidence of deep infection, no evidence of pus that can be drained out. YOU WILL NEED TO SEE A DENTIST to get appropriate care. Please take the antibiotics and pain meds provided in the interval.  Abscessed Tooth An abscessed tooth is an infection around your tooth. It may be caused by holes or damage to the tooth (cavity) or a dental disease. An abscessed tooth causes mild to very bad pain in and around the tooth. See your dentist right away if you have tooth or gum pain. HOME CARE  Take your medicine as told. Finish it even if you start to feel better.  Do not drive after taking pain medicine.  Rinse your mouth (gargle) often with salt water ( teaspoon salt in 8 ounces of warm water).  Do not apply heat to the outside of your face. GET HELP RIGHT AWAY IF:   You have a temperature by mouth above 102 F (38.9 C), not controlled by medicine.  You have chills and a very bad headache.  You have problems breathing or swallowing.  Your mouth will not open.  You develop puffiness (swelling) on the neck or around the eye.  Your pain is not helped by medicine.  Your pain is getting worse instead of better. MAKE SURE YOU:   Understand these instructions.  Will watch your condition.  Will get help right away if you are not doing well or get worse. Document Released: 09/24/2007 Document Revised: 06/30/2011 Document Reviewed: 07/16/2010 [facility] Patient Information 2015 [place], [place]. This information is not intended to replace advice given to you by your health care provider. Make sure you discuss any questions you have with your health care provider.

## 2015-01-10 NOTE — ED Notes (Addendum)
Pt reports hx of periodic fever syndrome, had fever last night, woke up with bed soaked. At present 99.2, reports lower jaw dental pain, body aches, dizziness, Headache x2 days. Pain 10/10. Pt reports he has been seen in ED in past for tooth abscess.

## 2015-05-28 ENCOUNTER — Encounter (HOSPITAL_COMMUNITY): Payer: Self-pay | Admitting: Emergency Medicine

## 2015-05-28 ENCOUNTER — Emergency Department (HOSPITAL_COMMUNITY)
Admission: EM | Admit: 2015-05-28 | Discharge: 2015-05-28 | Disposition: A | Discharge status: Home / Self Care | Payer: Medicare Other | Attending: Emergency Medicine | Admitting: Emergency Medicine

## 2015-05-28 DIAGNOSIS — M7981 Nontraumatic hematoma of soft tissue: Secondary | ICD-10-CM | POA: Diagnosis not present

## 2015-05-28 DIAGNOSIS — Z8739 Personal history of other diseases of the musculoskeletal system and connective tissue: Secondary | ICD-10-CM | POA: Insufficient documentation

## 2015-05-28 DIAGNOSIS — J45909 Unspecified asthma, uncomplicated: Secondary | ICD-10-CM | POA: Diagnosis not present

## 2015-05-28 DIAGNOSIS — M79644 Pain in right finger(s): Secondary | ICD-10-CM | POA: Diagnosis present

## 2015-05-28 DIAGNOSIS — Z87891 Personal history of nicotine dependence: Secondary | ICD-10-CM | POA: Insufficient documentation

## 2015-05-28 DIAGNOSIS — L608 Other nail disorders: Secondary | ICD-10-CM | POA: Diagnosis not present

## 2015-05-28 DIAGNOSIS — Z79899 Other long term (current) drug therapy: Secondary | ICD-10-CM | POA: Diagnosis not present

## 2015-05-28 MED ORDER — IBUPROFEN 200 MG PO TABS
600.0000 mg | ORAL_TABLET | Freq: Once | ORAL | Status: AC
  Administered 2015-05-28: 600 mg via ORAL
  Filled 2015-05-28: qty 3

## 2015-05-28 NOTE — ED Provider Notes (Signed)
CSN: [id number]     Arrival date & time 05/28/15  2125 History  By signing my name below, I, Rickey Becker, attest that this documentation has been prepared under the direction and in the presence of Earley Favor, NP-C. Electronically Signed: Phillis Becker, ED Scribe. 05/28/2015. 10:23 PM.   Chief Complaint  Patient presents with  . Hand Pain   The history is provided by the patient. No language interpreter was used.  HPI Comments: Rickey Becker is a 25 y.o. male who presents to the Emergency Department complaining of throbbing right thumb pain onset earlier this morning. Pt states he woke up with pain in his thumb with no known injury. He denies wound, numbness, or weakness.  Past Medical History  Diagnosis Date  . Periodic fever syndrome (HCC)   . Juvenile arthritis (HCC)   . Asthma    Past Surgical History  Procedure Laterality Date  . External ear surgery    . Inner ear surgery     History reviewed. No pertinent family history. Social History  Substance Use Topics  . Smoking status: Former Smoker    Types: Cigarettes  . Smokeless tobacco: Never Used  . Alcohol Use: No    Review of Systems  Musculoskeletal: Positive for arthralgias. Negative for joint swelling.  Skin: Negative for wound.  Neurological: Negative for weakness and numbness.  All other systems reviewed and are negative.     Allergies  Codeine  Home Medications   Prior to Admission medications   Medication Sig Start Date End Date Taking? Authorizing Provider  acetaminophen (TYLENOL) 500 MG tablet Take 1,000 mg by mouth every 6 (six) hours as needed for mild pain or headache.    Yes Historical Provider, MD  albuterol (PROVENTIL HFA;VENTOLIN HFA) 108 (90 BASE) MCG/ACT inhaler Inhale 2 puffs into the lungs every 4 (four) hours as needed for wheezing or shortness of breath (cough). Breathing 05/04/13  Yes Jennifer Piepenbrink, PA-C  Aspirin-Acetaminophen-Caffeine (GOODY HEADACHE PO) Take 1 packet by mouth  every 6 (six) hours as needed (for headache).   Yes Historical Provider, MD  ibuprofen (ADVIL,MOTRIN) 200 MG tablet Take 200-600 mg by mouth every 6 (six) hours as needed for headache or moderate pain.   Yes Historical Provider, MD  HYDROcodone-acetaminophen (NORCO/VICODIN) 5-325 MG per tablet Take 1 tablet by mouth every 6 (six) hours as needed. Patient not taking: Reported on 05/28/2015 01/10/15   Derwood Kaplan, MD  ibuprofen (ADVIL,MOTRIN) 600 MG tablet Take 1 tablet (600 mg total) by mouth every 6 (six) hours as needed. Patient not taking: Reported on 05/28/2015 01/10/15   Derwood Kaplan, MD   BP 117/81 mmHg  Pulse 91  Temp(Src) 97.8 F (36.6 C) (Oral)  Resp 18  SpO2 97% Physical Exam  Constitutional: He appears well-developed and well-nourished.  HENT:  Head: Normocephalic.  Eyes: Pupils are equal, round, and reactive to light.  Neck: Normal range of motion.  Cardiovascular: Normal rate.   Musculoskeletal: He exhibits tenderness. He exhibits no edema.  Nail bruise without hematoma or paronychia   Neurological: He is alert.  Skin: Skin is warm. No erythema.  Nursing note and vitals reviewed.   ED Course  Procedures (including critical care time) DIAGNOSTIC STUDIES: Oxygen Saturation is 97% on RA, normal by my interpretation.    COORDINATION OF CARE: 10:20 PM-Discussed treatment plan which includes warm soaks with pt at bedside and pt agreed to plan.    Labs Review Labs Reviewed - No data to display  Imaging Review No  results found. I have personally reviewed and evaluated these images and lab results as part of my medical decision-making.   EKG Interpretation None     It appears as though he bumped his finger on the distal end causing a superficial discoloration of the nail without hematoma of fracture of the nail No Paronychia  MDM   Final diagnoses:  None    I personally performed the services described in this documentation, which was scribed in my presence.  The recorded information has been reviewed and is accurate.   Earley Favor, NP 05/28/15 [id number]  Arby Barrette, MD 06/04/15 [phone]

## 2015-05-28 NOTE — Discharge Instructions (Signed)
It appears as though you bumped or thumb on the tip causing the nail to partially pull back in the corner causing the nail to become white there is no swelling or sign of infection You can safely take tylenol or Ibuprofen for discomfort

## 2015-05-28 NOTE — ED Notes (Signed)
Pt states that he has atraumatic pain to his R thumb. Denies injury. No swelling noted. Alert and oriented.

## 2015-05-31 ENCOUNTER — Encounter (HOSPITAL_COMMUNITY): Payer: Self-pay | Admitting: Emergency Medicine

## 2015-05-31 ENCOUNTER — Emergency Department (HOSPITAL_COMMUNITY): Payer: Medicare Other

## 2015-05-31 ENCOUNTER — Emergency Department (HOSPITAL_COMMUNITY)
Admission: EM | Admit: 2015-05-31 | Discharge: 2015-05-31 | Disposition: A | Discharge status: Home / Self Care | Payer: Medicare Other | Attending: Emergency Medicine | Admitting: Emergency Medicine

## 2015-05-31 DIAGNOSIS — R319 Hematuria, unspecified: Secondary | ICD-10-CM | POA: Insufficient documentation

## 2015-05-31 DIAGNOSIS — Z79899 Other long term (current) drug therapy: Secondary | ICD-10-CM | POA: Diagnosis not present

## 2015-05-31 DIAGNOSIS — Z87891 Personal history of nicotine dependence: Secondary | ICD-10-CM | POA: Diagnosis not present

## 2015-05-31 DIAGNOSIS — J45909 Unspecified asthma, uncomplicated: Secondary | ICD-10-CM | POA: Diagnosis not present

## 2015-05-31 DIAGNOSIS — R109 Unspecified abdominal pain: Secondary | ICD-10-CM | POA: Diagnosis not present

## 2015-05-31 DIAGNOSIS — Z8739 Personal history of other diseases of the musculoskeletal system and connective tissue: Secondary | ICD-10-CM | POA: Diagnosis not present

## 2015-05-31 DIAGNOSIS — R590 Localized enlarged lymph nodes: Secondary | ICD-10-CM | POA: Diagnosis not present

## 2015-05-31 LAB — CBC
HEMATOCRIT: 40.9 % (ref 39.0–52.0)
Hemoglobin: 13.5 g/dL (ref 13.0–17.0)
MCH: 26.3 pg (ref 26.0–34.0)
MCHC: 33 g/dL (ref 30.0–36.0)
MCV: 79.6 fL (ref 78.0–100.0)
Platelets: 217 10*3/uL (ref 150–400)
RBC: 5.14 MIL/uL (ref 4.22–5.81)
RDW: 15.8 % — ABNORMAL HIGH (ref 11.5–15.5)
WBC: 5.8 10*3/uL (ref 4.0–10.5)

## 2015-05-31 LAB — URINE MICROSCOPIC-ADD ON

## 2015-05-31 LAB — URINALYSIS, ROUTINE W REFLEX MICROSCOPIC
BILIRUBIN URINE: NEGATIVE
GLUCOSE, UA: NEGATIVE mg/dL
Ketones, ur: NEGATIVE mg/dL
Nitrite: NEGATIVE
PH: 6 (ref 5.0–8.0)
Protein, ur: NEGATIVE mg/dL
Specific Gravity, Urine: 1.026 (ref 1.005–1.030)

## 2015-05-31 LAB — COMPREHENSIVE METABOLIC PANEL
ALBUMIN: 3.9 g/dL (ref 3.5–5.0)
ALT: 40 U/L (ref 17–63)
AST: 30 U/L (ref 15–41)
Alkaline Phosphatase: 93 U/L (ref 38–126)
Anion gap: 8 (ref 5–15)
BUN: 9 mg/dL (ref 6–20)
CHLORIDE: 103 mmol/L (ref 101–111)
CO2: 31 mmol/L (ref 22–32)
Calcium: 9.1 mg/dL (ref 8.9–10.3)
Creatinine, Ser: 0.75 mg/dL (ref 0.61–1.24)
GFR calc Af Amer: >60 mL/min (ref >60)
GFR calc non Af Amer: >60 mL/min (ref >60)
GLUCOSE: 91 mg/dL (ref 65–99)
POTASSIUM: 3.6 mmol/L (ref 3.5–5.1)
Sodium: 142 mmol/L (ref 135–145)
Total Bilirubin: 0.7 mg/dL (ref 0.3–1.2)
Total Protein: 8.2 g/dL — ABNORMAL HIGH (ref 6.5–8.1)

## 2015-05-31 MED ORDER — AZITHROMYCIN 250 MG PO TABS
1000.0000 mg | ORAL_TABLET | Freq: Once | ORAL | Status: AC
  Administered 2015-05-31: 1000 mg via ORAL
  Filled 2015-05-31: qty 4

## 2015-05-31 MED ORDER — CEFTRIAXONE SODIUM 250 MG IJ SOLR
250.0000 mg | Freq: Once | INTRAMUSCULAR | Status: AC
  Administered 2015-05-31: 250 mg via INTRAMUSCULAR
  Filled 2015-05-31: qty 250

## 2015-05-31 MED ORDER — LIDOCAINE HCL (PF) 1 % IJ SOLN
INTRAMUSCULAR | Status: AC
  Administered 2015-05-31: 0.9 mL
  Filled 2015-05-31: qty 5

## 2015-05-31 NOTE — ED Provider Notes (Signed)
CSN: [id number]     Arrival date & time 05/31/15  1420 History   First MD Initiated Contact with Patient 05/31/15 1517     Chief Complaint  Patient presents with  . Hematuria  . Abdominal Pain   HPI Pt had some discomfort this am when urinating.  It wasn't too bad but an hour ago he had another episode where the pain was sharp and severe.  The pain was in the penis.  He noticed blood in the urine.  No back pain.  No fever or vomiting.  No abdominal pain.  No discharge.  Pt had unprotected intercourse recently.  He is concerned about the possibility of an STD. Past Medical History  Diagnosis Date  . Periodic fever syndrome (HCC)   . Juvenile arthritis (HCC)   . Asthma    Past Surgical History  Procedure Laterality Date  . External ear surgery    . Inner ear surgery     History reviewed. No pertinent family history. Social History  Substance Use Topics  . Smoking status: Former Smoker    Types: Cigarettes  . Smokeless tobacco: Never Used  . Alcohol Use: No    Review of Systems  All other systems reviewed and are negative.     Allergies  Codeine  Home Medications   Prior to Admission medications   Medication Sig Start Date End Date Taking? Authorizing Provider  acetaminophen (TYLENOL) 500 MG tablet Take 1,000 mg by mouth every 6 (six) hours as needed for mild pain or headache.    Yes Historical Provider, MD  albuterol (PROVENTIL HFA;VENTOLIN HFA) 108 (90 BASE) MCG/ACT inhaler Inhale 2 puffs into the lungs every 4 (four) hours as needed for wheezing or shortness of breath (cough). Breathing 05/04/13  Yes Jennifer Piepenbrink, PA-C  Aspirin-Acetaminophen-Caffeine (GOODY HEADACHE PO) Take 1 packet by mouth every 6 (six) hours as needed (for headache).   Yes Historical Provider, MD  ibuprofen (ADVIL,MOTRIN) 200 MG tablet Take 200-600 mg by mouth every 6 (six) hours as needed for headache or moderate pain.   Yes Historical Provider, MD  HYDROcodone-acetaminophen (NORCO/VICODIN)  5-325 MG per tablet Take 1 tablet by mouth every 6 (six) hours as needed. Patient not taking: Reported on 05/28/2015 01/10/15   Derwood Kaplan, MD  ibuprofen (ADVIL,MOTRIN) 600 MG tablet Take 1 tablet (600 mg total) by mouth every 6 (six) hours as needed. Patient not taking: Reported on 05/28/2015 01/10/15   Derwood Kaplan, MD   BP 129/88 mmHg  Pulse 96  Temp(Src) 97.8 F (36.6 C) (Oral)  Resp 18  SpO2 94% Physical Exam  Constitutional: He appears well-developed and well-nourished. No distress.  HENT:  Head: Normocephalic and atraumatic.  Right Ear: External ear normal.  Left Ear: External ear normal.  Eyes: Conjunctivae are normal. Right eye exhibits no discharge. Left eye exhibits no discharge. No scleral icterus.  Neck: Neck supple. No tracheal deviation present.  Cardiovascular: Normal rate, regular rhythm and normal heart sounds.   Pulmonary/Chest: Effort normal and breath sounds normal. No stridor. No respiratory distress.  Abdominal: Bowel sounds are normal. He exhibits no distension. There is no tenderness. Hernia confirmed negative in the right inguinal area and confirmed negative in the left inguinal area.  Genitourinary: Right testis shows no mass. Left testis shows mass. No penile erythema. No discharge found.  Musculoskeletal: He exhibits no edema.  Lymphadenopathy:       Right: Inguinal adenopathy present.       Left: Inguinal adenopathy present.  Neurological: He  is alert. Cranial nerve deficit: no gross deficits.  Skin: Skin is warm and dry. No rash noted.  Psychiatric: He has a normal mood and affect.  Nursing note and vitals reviewed.   ED Course  Procedures (including critical care time) Labs Review Labs Reviewed  COMPREHENSIVE METABOLIC PANEL - Abnormal; Notable for the following:    Total Protein 8.2 (*)    All other components within normal limits  CBC - Abnormal; Notable for the following:    RDW 15.8 (*)    All other components within normal limits   URINALYSIS, ROUTINE W REFLEX MICROSCOPIC (NOT AT [facility]) - Abnormal; Notable for the following:    Hgb urine dipstick MODERATE (*)    Leukocytes, UA SMALL (*)    All other components within normal limits  URINE MICROSCOPIC-ADD ON - Abnormal; Notable for the following:    Squamous Epithelial / LPF 0-5 (*)    Bacteria, UA RARE (*)    All other components within normal limits  RPR  HIV ANTIBODY (ROUTINE TESTING)  GC/CHLAMYDIA PROBE AMP ([place]) NOT AT [facility]    Imaging Review Ct Abdomen Pelvis Wo Contrast  05/31/2015  CLINICAL DATA:  Low pelvic pain with hematuria starting this morning EXAM: CT ABDOMEN AND PELVIS WITHOUT CONTRAST TECHNIQUE: Multidetector CT imaging of the abdomen and pelvis was performed following the standard protocol without IV contrast. COMPARISON:  February 04, 2006 FINDINGS: The liver, spleen, gallbladder and pancreas are normal. There is no nephrolithiasis or hydroureteronephrosis bilaterally. The kidneys are normal. The adrenal glands are normal. The aorta is normal. There are small less than cm lymph nodes in the retroperitoneum. This is stable compared prior exam. There is no small bowel obstruction or diverticulitis. The appendix is normal. Fluid-filled bladder is normal. There are stable enlarged lymph nodes in bilateral inguinal region unchanged. The lung bases are clear. No acute abnormalities identified in the visualized bones. IMPRESSION: No nephrolithiasis or hydroureteronephrosis bilaterally. No acute abnormality identified in the abdomen and pelvis. Lymph nodes in the retroperitoneum and bilateral inguinal regions stable compared to prior CT of 2007. Electronically Signed   By: Sherian Rein M.D.   On: 05/31/2015 16:07   I have personally reviewed and evaluated these images and lab results as part of my medical decision-making.    MDM   Final diagnoses:  Hematuria     She presented to the emergency room with complaints of dysuria and hematuria. Patient's  laboratory tests are notable for hematuria on his urine sample. CBC and metabolic panel unremarkable. CT scan does not show any evidence of ureteral stones. Patient's inguinal lymphadenopathy that I noted on exam currently is a chronic issue. We'll treat for possible STD. Follow up with a primary doctor or urology to make sure the hematuria resolves.  Linwood Dibbles, MD 05/31/15 Rickey Primus

## 2015-05-31 NOTE — ED Notes (Signed)
Bed: [facility] Expected date:  Expected time:  Means of arrival:  Comments: Imagene Riches

## 2015-05-31 NOTE — Discharge Instructions (Signed)

## 2015-05-31 NOTE — ED Notes (Signed)
Pt c/o hematuria, dysuria, and pain in abdomen and urethra onset this morning, worsened 1 hour ago. No other discharge. Pt had unprotected sexual intercourse 2 days ago. No n/v.

## 2015-06-01 LAB — HIV ANTIBODY (ROUTINE TESTING W REFLEX): HIV SCREEN 4TH GENERATION: NONREACTIVE

## 2015-06-01 LAB — RPR: RPR Ser Ql: NONREACTIVE

## 2015-06-01 LAB — GC/CHLAMYDIA PROBE AMP (~~LOC~~) NOT AT ARMC
Chlamydia: NEGATIVE
Neisseria Gonorrhea: NEGATIVE

## 2015-06-04 ENCOUNTER — Telehealth (HOSPITAL_COMMUNITY): Payer: Self-pay

## 2015-10-16 ENCOUNTER — Encounter (HOSPITAL_COMMUNITY): Payer: Self-pay | Admitting: *Deleted

## 2015-10-16 ENCOUNTER — Emergency Department (HOSPITAL_COMMUNITY)
Admission: EM | Admit: 2015-10-16 | Discharge: 2015-10-16 | Disposition: A | Discharge status: Home / Self Care | Payer: Medicare Other | Attending: Emergency Medicine | Admitting: Emergency Medicine

## 2015-10-16 DIAGNOSIS — Z87891 Personal history of nicotine dependence: Secondary | ICD-10-CM | POA: Diagnosis not present

## 2015-10-16 DIAGNOSIS — R519 Headache, unspecified: Secondary | ICD-10-CM

## 2015-10-16 DIAGNOSIS — H6092 Unspecified otitis externa, left ear: Secondary | ICD-10-CM | POA: Insufficient documentation

## 2015-10-16 DIAGNOSIS — R51 Headache: Secondary | ICD-10-CM | POA: Diagnosis not present

## 2015-10-16 DIAGNOSIS — M0890 Juvenile arthritis, unspecified, unspecified site: Secondary | ICD-10-CM | POA: Insufficient documentation

## 2015-10-16 DIAGNOSIS — H9202 Otalgia, left ear: Secondary | ICD-10-CM | POA: Diagnosis present

## 2015-10-16 DIAGNOSIS — Z79899 Other long term (current) drug therapy: Secondary | ICD-10-CM | POA: Insufficient documentation

## 2015-10-16 DIAGNOSIS — J45909 Unspecified asthma, uncomplicated: Secondary | ICD-10-CM | POA: Insufficient documentation

## 2015-10-16 DIAGNOSIS — Z7982 Long term (current) use of aspirin: Secondary | ICD-10-CM | POA: Insufficient documentation

## 2015-10-16 DIAGNOSIS — Z791 Long term (current) use of non-steroidal anti-inflammatories (NSAID): Secondary | ICD-10-CM | POA: Insufficient documentation

## 2015-10-16 MED ORDER — CIPROFLOXACIN-DEXAMETHASONE 0.3-0.1 % OT SUSP
4.0000 [drp] | Freq: Once | OTIC | Status: AC
  Administered 2015-10-16: 4 [drp] via OTIC
  Filled 2015-10-16: qty 7.5

## 2015-10-16 MED ORDER — PREDNISONE 20 MG PO TABS
60.0000 mg | ORAL_TABLET | Freq: Once | ORAL | Status: AC
  Administered 2015-10-16: 60 mg via ORAL
  Filled 2015-10-16: qty 3

## 2015-10-16 MED ORDER — PREDNISONE 10 MG PO TABS: ORAL_TABLET | ORAL | Status: DC

## 2015-10-16 NOTE — Discharge Instructions (Signed)
4 drops twice a day for ear pain. Ibuprofen or naprosyn for pain and headache. Prednisone as prescribed until all gone. Sudafed over the counter. Follow up with rheumatologist or primary care doctor.    Otitis Externa Otitis externa is a bacterial or fungal infection of the outer ear canal. This is the area from the eardrum to the outside of the ear. Otitis externa is sometimes called "swimmer's ear." CAUSES  Possible causes of infection include:  Swimming in dirty water.  Moisture remaining in the ear after swimming or bathing.  Mild injury (trauma) to the ear.  Objects stuck in the ear (foreign body).  Cuts or scrapes (abrasions) on the outside of the ear. SIGNS AND SYMPTOMS  The first symptom of infection is often itching in the ear canal. Later signs and symptoms may include swelling and redness of the ear canal, ear pain, and yellowish-white fluid (pus) coming from the ear. The ear pain may be worse when pulling on the earlobe. DIAGNOSIS  Your health care provider will perform a physical exam. A sample of fluid may be taken from the ear and examined for bacteria or fungi. TREATMENT  Antibiotic ear drops are often given for 10 to 14 days. Treatment may also include pain medicine or corticosteroids to reduce itching and swelling. HOME CARE INSTRUCTIONS   Apply antibiotic ear drops to the ear canal as prescribed by your health care provider.  Take medicines only as directed by your health care provider.  If you have diabetes, follow any additional treatment instructions from your health care provider.  Keep all follow-up visits as directed by your health care provider. PREVENTION   Keep your ear dry. Use the corner of a towel to absorb water out of the ear canal after swimming or bathing.  Avoid scratching or putting objects inside your ear. This can damage the ear canal or remove the protective wax that lines the canal. This makes it easier for bacteria and fungi to  grow.  Avoid swimming in lakes, polluted water, or poorly chlorinated pools.  You may use ear drops made of rubbing alcohol and vinegar after swimming. Combine equal parts of white vinegar and alcohol in a bottle. Put 3 or 4 drops into each ear after swimming. SEEK MEDICAL CARE IF:   You have a fever.  Your ear is still red, swollen, painful, or draining pus after 3 days.  Your redness, swelling, or pain gets worse.  You have a severe headache.  You have redness, swelling, pain, or tenderness in the area behind your ear. MAKE SURE YOU:   Understand these instructions.  Will watch your condition.  Will get help right away if you are not doing well or get worse.   This information is not intended to replace advice given to you by your health care provider. Make sure you discuss any questions you have with your health care provider.   Document Released: 04/07/2005 Document Revised: 04/28/2014 Document Reviewed: 04/24/2011 Else[employer] Interactive Patient Education [employer]2016 [employer]

## 2015-10-16 NOTE — ED Notes (Signed)
Patient c/o bilateral ear pain, anterior cervical chain LAD, frontal sinus pain and subjective fever x2 days.  Patient had some dental pain a day or so ago as well, but that has resolved.  Patient denies cough and nasal drainage.

## 2015-10-16 NOTE — ED Provider Notes (Signed)
CSN: [id number]     Arrival date & time 10/16/15  1701 History  By signing my name below, I, Rickey Becker, attest that this documentation has been prepared under the direction and in the presence of Rolanda Campa, PA-C. Electronically Signed: Placido SouLogan Becker, ED Scribe. 10/16/2015. 5:35 PM.    Chief Complaint  Patient presents with  . Otalgia   The history is provided by the patient. No language interpreter was used.    HPI Comments: Billey ChangMarcus A Dick is a 25 y.o. male with a PMHx of asthma and Hyper IgD syndrome who presents to the Emergency Department complaining of constant, moderate, bilateral, left greater than right, ear pain x 2 days. He reports associated subjective fever (98.1 F in triage), bilateral anterior neck pain, diffuse, mild, HA he describes as a pressure and mild wheezing. His throat pain worsens with palpation and denies it worsens when swallowing. He confirms his wheezing is consistent with prior asthma exacerbations further noting his subjective fevers are consistent with periodic fever syndrome. Pt applied hydrogen peroxide to his bilateral ears and took Dayquil without significant relief. He denies rhinorrhea, cough or any other associated symptoms at this time.    Past Medical History  Diagnosis Date  . Periodic fever syndrome (HCC)   . Juvenile arthritis (HCC)   . Asthma    Past Surgical History  Procedure Laterality Date  . External ear surgery    . Inner ear surgery     No family history on file. Social History  Substance Use Topics  . Smoking status: Former Smoker    Types: Cigarettes  . Smokeless tobacco: Never Used  . Alcohol Use: No    Review of Systems  Constitutional: Positive for fever.  HENT: Positive for ear pain. Negative for ear discharge, rhinorrhea and sore throat.   Respiratory: Positive for wheezing. Negative for cough.   Musculoskeletal: Positive for neck pain.  Neurological: Positive for headaches.    Allergies   Codeine  Home Medications   Prior to Admission medications   Medication Sig Start Date End Date Taking? Authorizing Provider  acetaminophen (TYLENOL) 500 MG tablet Take 1,000 mg by mouth every 6 (six) hours as needed for mild pain or headache.     Historical Provider, MD  albuterol (PROVENTIL HFA;VENTOLIN HFA) 108 (90 BASE) MCG/ACT inhaler Inhale 2 puffs into the lungs every 4 (four) hours as needed for wheezing or shortness of breath (cough). Breathing 05/04/13   Jennifer Piepenbrink, PA-C  Aspirin-Acetaminophen-Caffeine (GOODY HEADACHE PO) Take 1 packet by mouth every 6 (six) hours as needed (for headache).    Historical Provider, MD  HYDROcodone-acetaminophen (NORCO/VICODIN) 5-325 MG per tablet Take 1 tablet by mouth every 6 (six) hours as needed. Patient not taking: Reported on 05/28/2015 01/10/15   Derwood KaplanAnkit Nanavati, MD  ibuprofen (ADVIL,MOTRIN) 200 MG tablet Take 200-600 mg by mouth every 6 (six) hours as needed for headache or moderate pain.    Historical Provider, MD  ibuprofen (ADVIL,MOTRIN) 600 MG tablet Take 1 tablet (600 mg total) by mouth every 6 (six) hours as needed. Patient not taking: Reported on 05/28/2015 01/10/15   Derwood KaplanAnkit Nanavati, MD   BP 126/83 mmHg  Pulse 69  Temp(Src) 98.1 F (36.7 C) (Oral)  Resp 20  Ht 6' (1.829 m)  Wt 250 lb (113.399 kg)  BMI 33.90 kg/m2  SpO2 99%    Physical Exam  Constitutional: He is oriented to person, place, and time. He appears well-developed and well-nourished.  HENT:  Head: Normocephalic and atraumatic.  Mouth/Throat: Uvula is midline, oropharynx is clear and moist and mucous membranes are normal.  Normal external ears bilaterally. Right ear, normal ear canal, normal TM. Left ear, mild inflammation of the ear canal with white exudate. TM appears to be normal. Oropharynx is normal, uvula midline.  Eyes: EOM are normal.  Neck: Normal range of motion. Neck supple.  Cardiovascular: Normal rate, regular rhythm and normal heart sounds.  Exam  reveals no gallop and no friction rub.   No murmur heard. Pulmonary/Chest: Effort normal and breath sounds normal. No respiratory distress. He has no wheezes. He has no rales.  Abdominal: Soft.  Musculoskeletal: Normal range of motion.  Lymphadenopathy:    He has no cervical adenopathy.  Neurological: He is alert and oriented to person, place, and time.  Skin: Skin is warm and dry.  Psychiatric: He has a normal mood and affect.  Nursing note and vitals reviewed.   ED Course  Procedures  DIAGNOSTIC STUDIES: Oxygen Saturation is 99% on RA, normal by my interpretation.    COORDINATION OF CARE: 5:33 PM Discussed next steps with pt. Pt verbalized understanding and is agreeable with the plan.   Labs Review Labs Reviewed - No data to display  Imaging Review No results found.   EKG Interpretation None      MDM   Final diagnoses:  Otitis externa, left  Nonintractable headache, unspecified chronicity pattern, unspecified headache type      Nonintractable headache, unspecified chronicity pattern, unspecified headache type   Patient emergency department with bilateral, left worse than right ear pain, neck pain, headache. He is afebrile, vital signs are normal. He does have history of hyper IgD syndrome and he is currently not followed by anyone. He is to be followed by [facility]. His exam is unremarkable other than slight inflammation in and exudate in the left ear canal. No meningismus. No evidence of tonsillitis or peritonsillar abscess. He has no pain with swallowing or eating. Suspect otitis externa. Will treat with Ciprodex drops. Will also prescribe prednisone for possible HIDS flare. Will have patient follow-up with his rheumatologist. Return precautions discussed.  Filed Vitals:   10/16/15 1706  BP: 126/83  Pulse: 69  Temp: 98.1 F (36.7 C)  TempSrc: Oral  Resp: 20  Height: 6' (1.829 m)  Weight: 113.399 kg  SpO2: 99%   I personally performed the services described in  this documentation, which was scribed in my presence. The recorded information has been reviewed and is accurate.   Jaynie Crumbleatyana Jalien Weakland, PA-C 10/16/15 1803  Arby BarretteMarcy Pfeiffer, MD 10/20/15 1051

## 2015-12-05 ENCOUNTER — Emergency Department (HOSPITAL_COMMUNITY)
Admission: EM | Admit: 2015-12-05 | Discharge: 2015-12-05 | Disposition: A | Discharge status: Home / Self Care | Payer: Medicare Other | Attending: Emergency Medicine | Admitting: Emergency Medicine

## 2015-12-05 ENCOUNTER — Encounter (HOSPITAL_COMMUNITY): Payer: Self-pay | Admitting: Nurse Practitioner

## 2015-12-05 ENCOUNTER — Emergency Department (HOSPITAL_COMMUNITY): Payer: Medicare Other

## 2015-12-05 DIAGNOSIS — Z87891 Personal history of nicotine dependence: Secondary | ICD-10-CM | POA: Insufficient documentation

## 2015-12-05 DIAGNOSIS — Y999 Unspecified external cause status: Secondary | ICD-10-CM | POA: Diagnosis not present

## 2015-12-05 DIAGNOSIS — Z791 Long term (current) use of non-steroidal anti-inflammatories (NSAID): Secondary | ICD-10-CM | POA: Insufficient documentation

## 2015-12-05 DIAGNOSIS — Y9367 Activity, basketball: Secondary | ICD-10-CM | POA: Diagnosis not present

## 2015-12-05 DIAGNOSIS — X58XXXA Exposure to other specified factors, initial encounter: Secondary | ICD-10-CM | POA: Insufficient documentation

## 2015-12-05 DIAGNOSIS — Y929 Unspecified place or not applicable: Secondary | ICD-10-CM | POA: Diagnosis not present

## 2015-12-05 DIAGNOSIS — J45909 Unspecified asthma, uncomplicated: Secondary | ICD-10-CM | POA: Insufficient documentation

## 2015-12-05 DIAGNOSIS — Z7951 Long term (current) use of inhaled steroids: Secondary | ICD-10-CM | POA: Diagnosis not present

## 2015-12-05 DIAGNOSIS — S5001XA Contusion of right elbow, initial encounter: Secondary | ICD-10-CM

## 2015-12-05 DIAGNOSIS — Z79891 Long term (current) use of opiate analgesic: Secondary | ICD-10-CM | POA: Insufficient documentation

## 2015-12-05 DIAGNOSIS — S59901A Unspecified injury of right elbow, initial encounter: Secondary | ICD-10-CM | POA: Diagnosis present

## 2015-12-05 LAB — CBC WITH DIFFERENTIAL/PLATELET
BASOS ABS: 0 10*3/uL (ref 0.0–0.1)
BASOS PCT: 0 %
EOS ABS: 0.1 10*3/uL (ref 0.0–0.7)
EOS PCT: 1 %
HCT: 42.1 % (ref 39.0–52.0)
Hemoglobin: 14.4 g/dL (ref 13.0–17.0)
Lymphocytes Relative: 17 %
Lymphs Abs: 1.3 10*3/uL (ref 0.7–4.0)
MCH: 26.8 pg (ref 26.0–34.0)
MCHC: 34.2 g/dL (ref 30.0–36.0)
MCV: 78.4 fL (ref 78.0–100.0)
MONO ABS: 0.7 10*3/uL (ref 0.1–1.0)
Monocytes Relative: 9 %
Neutro Abs: 5.8 10*3/uL (ref 1.7–7.7)
Neutrophils Relative %: 73 %
Platelets: 231 10*3/uL (ref 150–400)
RBC: 5.37 MIL/uL (ref 4.22–5.81)
RDW: 15.8 % — ABNORMAL HIGH (ref 11.5–15.5)
WBC: 7.9 10*3/uL (ref 4.0–10.5)

## 2015-12-05 LAB — BASIC METABOLIC PANEL
ANION GAP: 7 (ref 5–15)
BUN: 8 mg/dL (ref 6–20)
CALCIUM: 8.9 mg/dL (ref 8.9–10.3)
CO2: 26 mmol/L (ref 22–32)
CREATININE: 0.79 mg/dL (ref 0.61–1.24)
Chloride: 104 mmol/L (ref 101–111)
GFR calc Af Amer: >60 mL/min (ref >60)
GLUCOSE: 98 mg/dL (ref 65–99)
Potassium: 3.8 mmol/L (ref 3.5–5.1)
Sodium: 137 mmol/L (ref 135–145)

## 2015-12-05 LAB — I-STAT TROPONIN, ED: TROPONIN I, POC: 0.01 ng/mL (ref 0.00–0.08)

## 2015-12-05 MED ORDER — PREDNISONE 20 MG PO TABS
60.0000 mg | ORAL_TABLET | Freq: Once | ORAL | Status: AC
  Administered 2015-12-05: 60 mg via ORAL
  Filled 2015-12-05: qty 3

## 2015-12-05 MED ORDER — IPRATROPIUM-ALBUTEROL 0.5-2.5 (3) MG/3ML IN SOLN
3.0000 mL | Freq: Once | RESPIRATORY_TRACT | Status: AC
  Administered 2015-12-05: 3 mL via RESPIRATORY_TRACT
  Filled 2015-12-05: qty 3

## 2015-12-05 MED ORDER — HYDROCODONE-ACETAMINOPHEN 5-325 MG PO TABS: 1.0000 | ORAL_TABLET | ORAL | 0 refills | Status: DC | PRN

## 2015-12-05 NOTE — ED Provider Notes (Signed)
MSE was initiated and I personally evaluated the patient and placed orders (if any) at  12:49 PM on December 05, 2015.    Pt is a 25 y/o male with CC of right elbow pain with hard nodule and surrounding bruising with pain.  He was playing sports two days ago when it first started hurting, but he does not recall a specific mechanism of injury.  He has medical hx of hyper IgD syndrome and believes his arm trauma has caused a flare.  He complains of chest pain, SOB, wheeze, whole body pain, swollen lymph nodes, and fever.    His VS are WNL.  Orders have been placed for work up, he will need cardiac monitoring in the main ED.   The patient appears stable so that the remainder of the MSE may be completed by another provider.   Danelle BerryLeisa Arrion Broaddus, PA-C 12/05/15 1253    Mancel BaleElliott Wentz, MD 12/05/15 1736

## 2015-12-05 NOTE — ED Triage Notes (Signed)
Patient presents to [facility]ED for complaints of right arm/elbow pain that started 2 days ago while playing basketball. He is unsure of the mechanism of injury but says he looked down and saw a 'goose egg' and redness/bruising. He suffers from hyper IgD syndrome and feels that this injury has exaserbated those symptoms. He now complains of swollen glands, headache, thrush on his tongue. Fever, wheezing, and chest pain. He is also out of his inhaler for his asthma.

## 2015-12-05 NOTE — ED Notes (Signed)
Respiratory made aware of breathing treatment. 

## 2015-12-05 NOTE — Discharge Instructions (Signed)
Use heat on the sore area 3 or 4 times a day.  If your condition worsens, you may need to see your oncologist, [place], [place].

## 2015-12-05 NOTE — ED Provider Notes (Signed)
[facility]-EMERGENCY DEPT Provider Note   CSN: [id number] Arrival date & time: 12/05/15  1209     History   Chief Complaint Chief Complaint  Patient presents with  . Arm Pain    right arm    HPI Rickey Becker is a 25 y.o. male.   He is here for evaluation of a injury to his right elbow, followed by generalized achiness, cough, weakness, nausea but no vomiting or diarrhea. Denies fever, chills, urinary frequency, or blood in stool at this time. He has a history of blood in urine and stool, related to a chronic illness, which he describes as immunoglobulin D hyperactivity. He has not been seen or treated for this problem and at least 7 years. He states that previously he was treated with "prednisone". He thinks he might have injured his right elbow, during a basketball game, 2 days ago. At that time, he noticed sudden onset of swelling in the right medial elbow, which has improved, with less swelling but remains tender. He is not using anything for pain at home. There are no other known modifying factors.   HPI  Past Medical History:  Diagnosis Date  . Asthma   . Juvenile arthritis (HCC)   . Periodic fever syndrome (HCC)     There are no active problems to display for this patient.   Past Surgical History:  Procedure Laterality Date  . EXTERNAL EAR SURGERY    . INNER EAR SURGERY         Home Medications    Prior to Admission medications   Medication Sig Start Date End Date Taking? Authorizing Provider  acetaminophen (TYLENOL) 500 MG tablet Take 1,000 mg by mouth every 6 (six) hours as needed for mild pain or headache.    Yes Historical Provider, MD  ibuprofen (ADVIL,MOTRIN) 200 MG tablet Take 200-600 mg by mouth every 6 (six) hours as needed for headache or moderate pain.   Yes Historical Provider, MD  albuterol (PROVENTIL HFA;VENTOLIN HFA) 108 (90 BASE) MCG/ACT inhaler Inhale 2 puffs into the lungs every 4 (four) hours as needed for wheezing or shortness of breath  (cough). Breathing Patient not taking: Reported on 12/05/2015 05/04/13   Francee Piccolo, PA-C  HYDROcodone-acetaminophen (NORCO) 5-325 MG tablet Take 1 tablet by mouth every 4 (four) hours as needed. 12/05/15   Mancel Bale, MD    Family History History reviewed. No pertinent family history.  Social History Social History  Substance Use Topics  . Smoking status: Former Smoker    Types: Cigarettes  . Smokeless tobacco: Never Used  . Alcohol use No     Allergies   Codeine   Review of Systems Review of Systems  All other systems reviewed and are negative.    Physical Exam Updated Vital Signs BP 118/78 (BP Location: Right Arm)   Pulse 66   Temp 98 F (36.7 C) (Oral)   Resp 18   Ht 6' (1.829 m)   Wt 251 lb (113.9 kg)   SpO2 98%   BMI 34.04 kg/m   Physical Exam  Constitutional: He is oriented to person, place, and time. He appears well-developed and well-nourished.  HENT:  Head: Normocephalic and atraumatic.  Right Ear: External ear normal.  Left Ear: External ear normal.  Eyes: Conjunctivae and EOM are normal. Pupils are equal, round, and reactive to light.  Neck: Normal range of motion and phonation normal. Neck supple.  Cardiovascular: Normal rate, regular rhythm and normal heart sounds.   Pulmonary/Chest: Effort normal and  breath sounds normal. No respiratory distress. He has no wheezes. He exhibits no tenderness and no bony tenderness.  Abdominal: Soft. There is no tenderness.  Musculoskeletal: Normal range of motion.  Right medial elbow with superficial contusion, and mild to moderate surrounding flat ecchymosis. No instability of the right elbow.  Neurological: He is alert and oriented to person, place, and time. No cranial nerve deficit or sensory deficit. He exhibits normal muscle tone. Coordination normal.  Skin: Skin is warm, dry and intact.  Psychiatric: He has a normal mood and affect. His behavior is normal. Judgment and thought content normal.    Nursing note and vitals reviewed.    ED Treatments / Results  Labs (all labs ordered are listed, but only abnormal results are displayed) Labs Reviewed  CBC WITH DIFFERENTIAL/PLATELET - Abnormal; Notable for the following:       Result Value   RDW 15.8 (*)    All other components within normal limits  BASIC METABOLIC PANEL  I-STAT TROPOININ, ED    EKG  EKG Interpretation None       Radiology Dg Chest 2 View  Result Date: 12/05/2015 CLINICAL DATA:  Shortness of breath and cough for 2 days EXAM: CHEST  2 VIEW COMPARISON:  05/04/2013 FINDINGS: The heart size and mediastinal contours are within normal limits. Both lungs are clear. The visualized skeletal structures are unremarkable. IMPRESSION: No active cardiopulmonary disease. Electronically Signed   By: Alcide CleverMark  Lukens M.D.   On: 12/05/2015 13:15   Dg Elbow Complete Right  Result Date: 12/05/2015 CLINICAL DATA:  Right elbow pain and swelling for 2 days following playing basketball, initial encounter EXAM: RIGHT ELBOW - COMPLETE 3+ VIEW COMPARISON:  None. FINDINGS: There is no evidence of fracture, dislocation, or joint effusion. There is no evidence of arthropathy or other focal bone abnormality. Soft tissues are unremarkable. IMPRESSION: No acute abnormality noted. Electronically Signed   By: Alcide CleverMark  Lukens M.D.   On: 12/05/2015 13:15    Procedures Procedures (including critical care time)  Medications Ordered in ED Medications  predniSONE (DELTASONE) tablet 60 mg (60 mg Oral Given 12/05/15 1333)  ipratropium-albuterol (DUONEB) 0.5-2.5 (3) MG/3ML nebulizer solution 3 mL (3 mLs Nebulization Given 12/05/15 1313)     Initial Impression / Assessment and Plan / ED Course  I have reviewed the triage vital signs and the nursing notes.  Pertinent labs & imaging results that were available during my care of the patient were reviewed by me and considered in my medical decision making (see chart for details).  Clinical Course     Nonspecific injury, right elbow. Symptoms are improving. Doubt fracture, DVT, cellulitis. It is unclear if this is related to his chronic illness, IgD hyperactivity syndrome. Hemodynamically stable. No indication for further assessment or treatment in the emergency department setting at this time.  Nursing Notes Reviewed/ Care Coordinated Applicable Imaging Reviewed Interpretation of Laboratory Data incorporated into ED treatment  The patient appears reasonably screened and/or stabilized for discharge and I doubt any other medical condition or other [facility] requiring further screening, evaluation, or treatment in the ED at this time prior to discharge.  Plan: Home Medications- Norco; Home Treatments- rest, heat; return here if the recommended treatment, does not improve the symptoms; Recommended follow up- PCP prn  Final Clinical Impressions(s) / ED Diagnoses   Final diagnoses:  Elbow contusion, right, initial encounter    New Prescriptions New Prescriptions   HYDROCODONE-ACETAMINOPHEN (NORCO) 5-325 MG TABLET    Take 1 tablet by mouth every  4 (four) hours as needed.     Mancel BaleElliott Marlee Armenteros, MD 12/05/15 [phone]

## 2015-12-05 NOTE — ED Notes (Signed)
Bed: [id number] Expected date:  Expected time:  Means of arrival:  Comments: 7026

## 2015-12-05 NOTE — ED Notes (Signed)
Patient transported to X-ray 

## 2016-01-30 ENCOUNTER — Encounter (HOSPITAL_COMMUNITY): Payer: Self-pay | Admitting: *Deleted

## 2016-01-30 ENCOUNTER — Emergency Department (HOSPITAL_COMMUNITY)
Admission: EM | Admit: 2016-01-30 | Discharge: 2016-01-30 | Disposition: A | Discharge status: Home / Self Care | Payer: Medicare Other | Attending: Dermatology | Admitting: Dermatology

## 2016-01-30 DIAGNOSIS — X500XXA Overexertion from strenuous movement or load, initial encounter: Secondary | ICD-10-CM | POA: Insufficient documentation

## 2016-01-30 DIAGNOSIS — Y990 Civilian activity done for income or pay: Secondary | ICD-10-CM | POA: Insufficient documentation

## 2016-01-30 DIAGNOSIS — Y929 Unspecified place or not applicable: Secondary | ICD-10-CM | POA: Insufficient documentation

## 2016-01-30 DIAGNOSIS — R1905 Periumbilic swelling, mass or lump: Secondary | ICD-10-CM | POA: Insufficient documentation

## 2016-01-30 DIAGNOSIS — Z5321 Procedure and treatment not carried out due to patient leaving prior to being seen by health care provider: Secondary | ICD-10-CM | POA: Diagnosis not present

## 2016-01-30 DIAGNOSIS — Z79899 Other long term (current) drug therapy: Secondary | ICD-10-CM | POA: Diagnosis not present

## 2016-01-30 DIAGNOSIS — J45909 Unspecified asthma, uncomplicated: Secondary | ICD-10-CM | POA: Diagnosis not present

## 2016-01-30 DIAGNOSIS — Z87891 Personal history of nicotine dependence: Secondary | ICD-10-CM | POA: Diagnosis not present

## 2016-01-30 DIAGNOSIS — Y9389 Activity, other specified: Secondary | ICD-10-CM | POA: Insufficient documentation

## 2016-01-30 NOTE — ED Triage Notes (Signed)
Pt complains of an uncomfortable knot in behind his umbilical area that he is is concerned may be a hernia. Pt states he noticed the knot while lifting a heavy object at work.

## 2016-01-30 NOTE — ED Notes (Signed)
Pt gave front desk clerk labels decided to leave.

## 2016-05-03 ENCOUNTER — Emergency Department (HOSPITAL_COMMUNITY)
Admission: EM | Admit: 2016-05-03 | Discharge: 2016-05-03 | Disposition: A | Discharge status: Home / Self Care | Payer: Medicare HMO | Attending: Emergency Medicine | Admitting: Emergency Medicine

## 2016-05-03 ENCOUNTER — Encounter (HOSPITAL_COMMUNITY): Payer: Self-pay | Admitting: Emergency Medicine

## 2016-05-03 DIAGNOSIS — K0889 Other specified disorders of teeth and supporting structures: Secondary | ICD-10-CM | POA: Diagnosis present

## 2016-05-03 DIAGNOSIS — J45909 Unspecified asthma, uncomplicated: Secondary | ICD-10-CM | POA: Diagnosis not present

## 2016-05-03 DIAGNOSIS — Z79899 Other long term (current) drug therapy: Secondary | ICD-10-CM | POA: Insufficient documentation

## 2016-05-03 DIAGNOSIS — Z87891 Personal history of nicotine dependence: Secondary | ICD-10-CM | POA: Insufficient documentation

## 2016-05-03 LAB — RAPID STREP SCREEN (MED CTR MEBANE ONLY): Streptococcus, Group A Screen (Direct): NEGATIVE

## 2016-05-03 MED ORDER — MORPHINE SULFATE 15 MG PO TABS: 15.0000 mg | ORAL_TABLET | Freq: Four times a day (QID) | ORAL | 0 refills | Status: DC | PRN

## 2016-05-03 MED ORDER — PENICILLIN V POTASSIUM 500 MG PO TABS: 500.0000 mg | ORAL_TABLET | Freq: Four times a day (QID) | ORAL | 0 refills | Status: AC

## 2016-05-03 NOTE — ED Triage Notes (Signed)
Pt c/o dental pain to left molar, sore throat. Pt states he has chronic problems with his wisdom teeth because his insurance does not cover wisdom tooth extraction.  Tonsils 2+ with exudate. TM pearly gray, cone of light diffuse.

## 2016-05-03 NOTE — Discharge Instructions (Signed)
Read the information below.  Use the prescribed medication as directed.  Please discuss all new medications with your pharmacist.  You may return to the Emergency Department at any time for worsening condition or any new symptoms that concern you.  If you develop high persistent fevers, increased swelling in your face, difficulty swallowing or breathing, return to the ER immediately for a recheck.

## 2016-05-03 NOTE — ED Provider Notes (Signed)
[facility]PT Provider Note   CSN: [id number] Arrival date & time: 05/03/16  1722  By signing my name below, I, Octavia Heir, attest that this documentation has been prepared under the direction and in the presence of [facility], PA-C.  Electronically Signed: Octavia Heir, ED Scribe. 05/03/16. 6:19 PM.    History   Chief Complaint Chief Complaint  Patient presents with  . Dental Pain   The history is provided by the patient. No language interpreter was used.   HPI Comments: Rickey Becker is a 26 y.o. male who has a PMhx of periodic fever syndrome presents to the Emergency Department complaining of moderate, constant, recurrent, left lower dental pain x 1 year with a flare up of pain x 1 week. He note associated sore throat, left sided headache, neck pain, left ear pain that is described as a stabbing sensation, and left neck pain. He expresses that he was supposed to have the tooth extracted on year ago but states his insurance was unable to cover it. Pt has an appointment with a dentist in 3 days. He has been taking ibuprofen to alleviate his pain without relief. Pt does not have neck stiffness or chills.   Past Medical History:  Diagnosis Date  . Asthma   . Juvenile arthritis (HCC)   . Periodic fever syndrome (HCC)     There are no active problems to display for this patient.   Past Surgical History:  Procedure Laterality Date  . EXTERNAL EAR SURGERY    . INNER EAR SURGERY         Home Medications    Prior to Admission medications   Medication Sig Start Date End Date Taking? Authorizing Provider  acetaminophen (TYLENOL) 500 MG tablet Take 1,000 mg by mouth every 6 (six) hours as needed for mild pain or headache.     Historical Provider, MD  albuterol (PROVENTIL HFA;VENTOLIN HFA) 108 (90 BASE) MCG/ACT inhaler Inhale 2 puffs into the lungs every 4 (four) hours as needed for wheezing or shortness of breath (cough). Breathing Patient not taking: Reported on  12/05/2015 05/04/13   Francee Piccolo, PA-C  HYDROcodone-acetaminophen (NORCO) 5-325 MG tablet Take 1 tablet by mouth every 4 (four) hours as needed. 12/05/15   Mancel Bale, MD  ibuprofen (ADVIL,MOTRIN) 200 MG tablet Take 200-600 mg by mouth every 6 (six) hours as needed for headache or moderate pain.    Historical Provider, MD  morphine (MSIR) 15 MG tablet Take 1 tablet (15 mg total) by mouth every 6 (six) hours as needed for severe pain. 05/03/16   Trixie Dredge, PA-C  penicillin v potassium (VEETID) 500 MG tablet Take 1 tablet (500 mg total) by mouth 4 (four) times daily. 05/03/16 05/10/16  Trixie Dredge, PA-C    Family History History reviewed. No pertinent family history.  Social History Social History  Substance Use Topics  . Smoking status: Former Smoker    Types: Cigarettes  . Smokeless tobacco: Never Used  . Alcohol use No     Allergies   Codeine   Review of Systems Review of Systems  Constitutional: Negative for chills and fever.  HENT: Positive for dental problem, ear pain, facial swelling and sore throat. Negative for trouble swallowing.   Respiratory: Negative for shortness of breath, wheezing and stridor.   Musculoskeletal: Negative for neck stiffness.  Skin: Negative for color change and rash.     Physical Exam Updated Vital Signs BP 133/77 (BP Location: Left Arm)   Pulse 61  Temp 97.7 F (36.5 C) (Oral)   Resp 16   SpO2 97%   Physical Exam  Constitutional: He appears well-developed and well-nourished. No distress.  HENT:  Head: Normocephalic and atraumatic.  Mouth/Throat: Uvula is midline and oropharynx is clear and moist. Mucous membranes are not dry. No uvula swelling. No oropharyngeal exudate, posterior oropharyngeal edema, posterior oropharyngeal erythema or tonsillar abscesses.  Left lower 3rd molar with tenderness to percussion adjeacent lymphadenopathy and soft tissue swelling in the submandibular space. Pharynx without edema or exudate. Airway  widely patent. No stridor on exam. No anterior neck edema or erythema.  Neck: Normal range of motion. Neck supple.  Cardiovascular: Normal rate.   Pulmonary/Chest: Effort normal and breath sounds normal. No stridor.  Lymphadenopathy:    He has no cervical adenopathy.  Neurological: He is alert.  Skin: He is not diaphoretic.  Nursing note and vitals reviewed.    ED Treatments / Results  DIAGNOSTIC STUDIES: Oxygen Saturation is 97% on RA, normal by my interpretation.  COORDINATION OF CARE:  6:17 PM Discussed treatment plan with pt at bedside and pt agreed to plan.  Labs (all labs ordered are listed, but only abnormal results are displayed) Labs Reviewed  RAPID STREP SCREEN (NOT AT [facility])  CULTURE, GROUP A STREP [facility])    EKG  EKG Interpretation None       Radiology No results found.  Procedures Procedures (including critical care time)  Medications Ordered in ED Medications - No data to display   Initial Impression / Assessment and Plan / ED Course  I have reviewed the triage vital signs and the nursing notes.  Pertinent labs & imaging results that were available during my care of the patient were reviewed by me and considered in my medical decision making (see chart for details).  Clinical Course     Afebrile, nontoxic patient with new dental pain and possible abscess.  No airway concerns. No e/o Ludwig's angina.  D/C home with antibiotic, pain medication and dental follow up.  Discussed findings, treatment, and follow up  with patient.  Pt given return precautions.  Pt verbalizes understanding and agrees with plan.       Final Clinical Impressions(s) / ED Diagnoses   Final diagnoses:  Pain, dental     I personally performed the services described in this documentation, which was scribed in my presence. The recorded information has been reviewed and is accurate.   New Prescriptions Discharge Medication List as of 05/03/2016  6:27 PM    START taking these  medications   Details  morphine (MSIR) 15 MG tablet Take 1 tablet (15 mg total) by mouth every 6 (six) hours as needed for severe pain., Starting Sat 05/03/2016, Print    penicillin v potassium (VEETID) 500 MG tablet Take 1 tablet (500 mg total) by mouth 4 (four) times daily., Starting Sat 05/03/2016, Until Sat 05/10/2016, Print         [place] Shekira Drummer, [place]-C 05/03/16 1940    Nelva Nayobert Beaton, MD 05/04/16 1350

## 2016-05-06 LAB — CULTURE, GROUP A STREP (THRC)

## 2016-10-18 IMAGING — CR DG ELBOW COMPLETE 3+V*R*
4 series · 4 of 4 positions shown · non-contrast
Comparison: None.

CLINICAL DATA: Right elbow pain and swelling for 2 days following
playing basketball, initial encounter

EXAM:
RIGHT ELBOW - COMPLETE 3+ VIEW

[x elbow ap right]
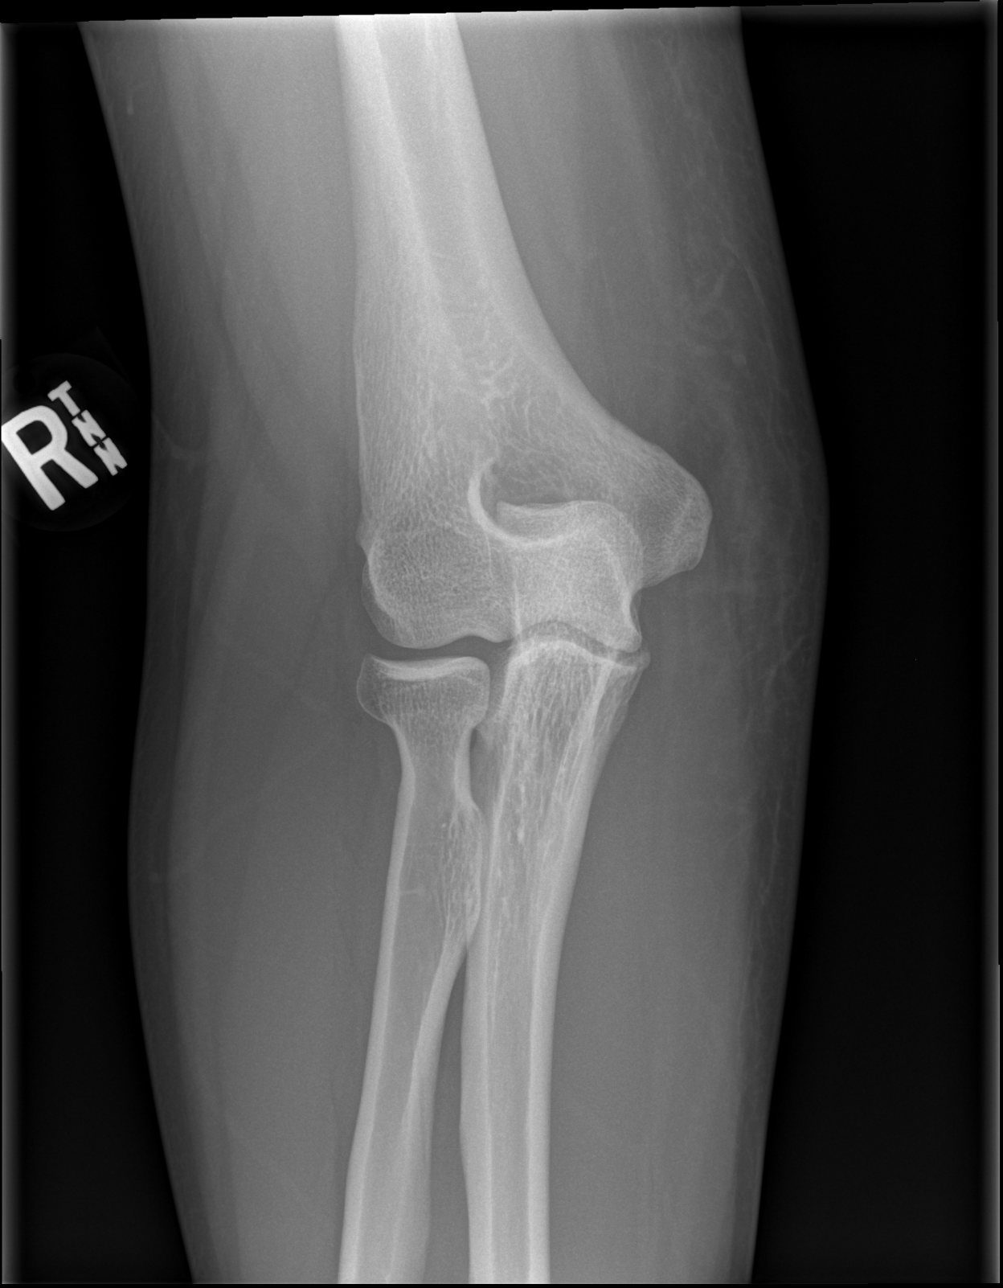

[x elbow obl right (1 of 2)]
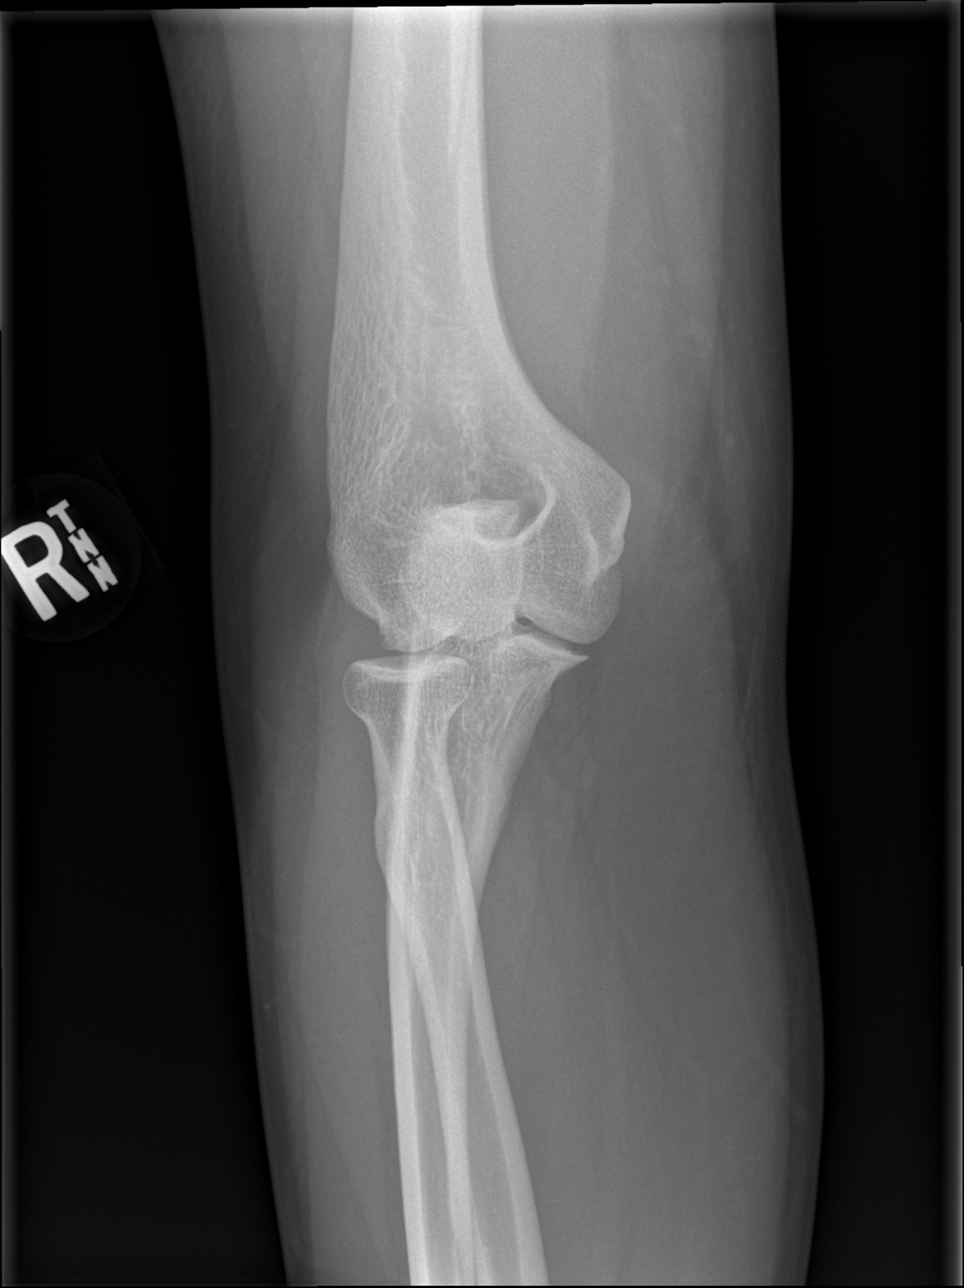

[x elbow obl right (2 of 2)]
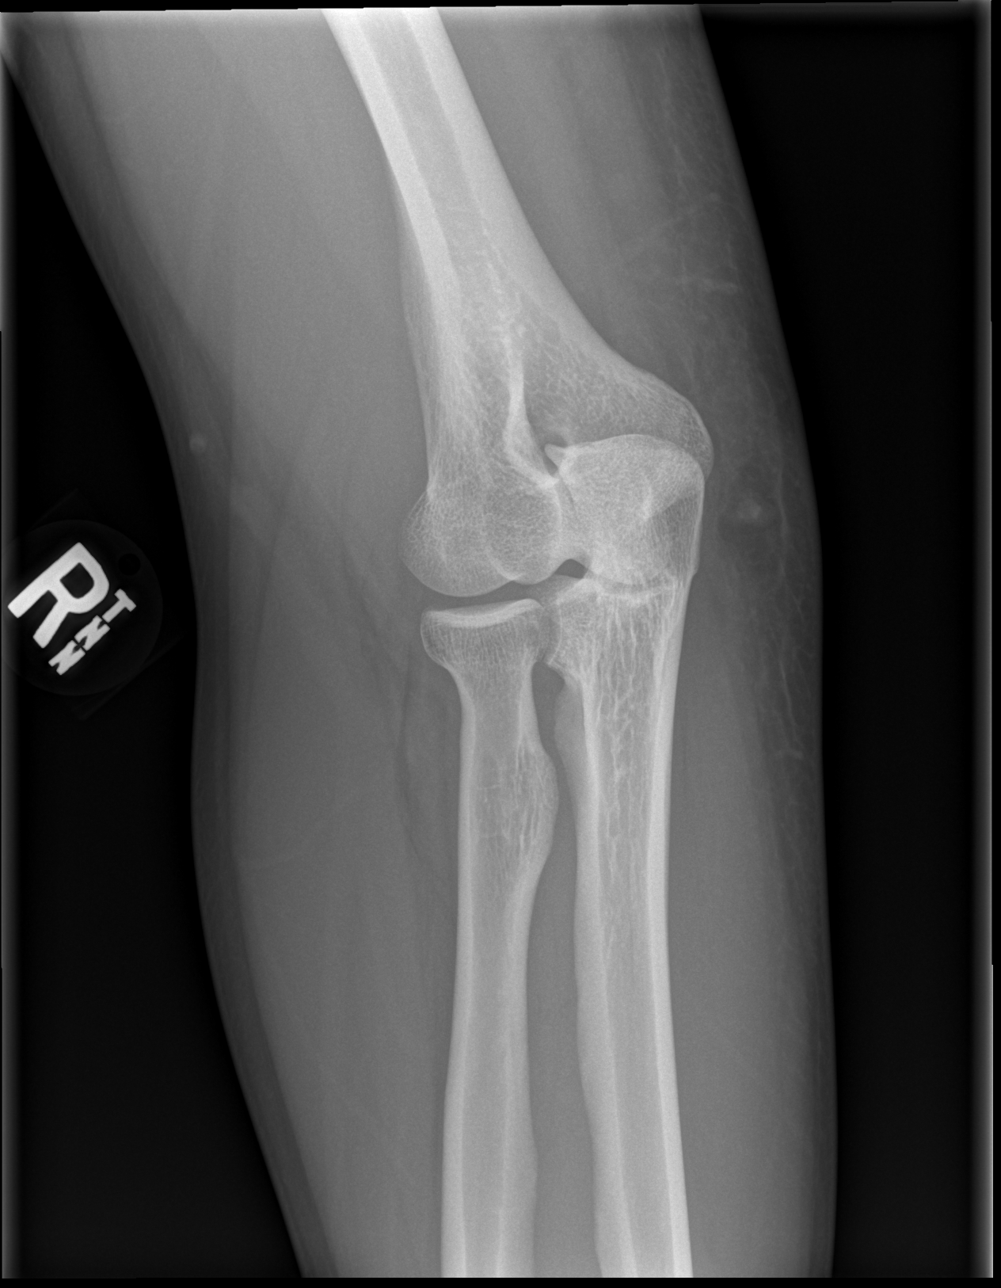

[x elbow lat right]
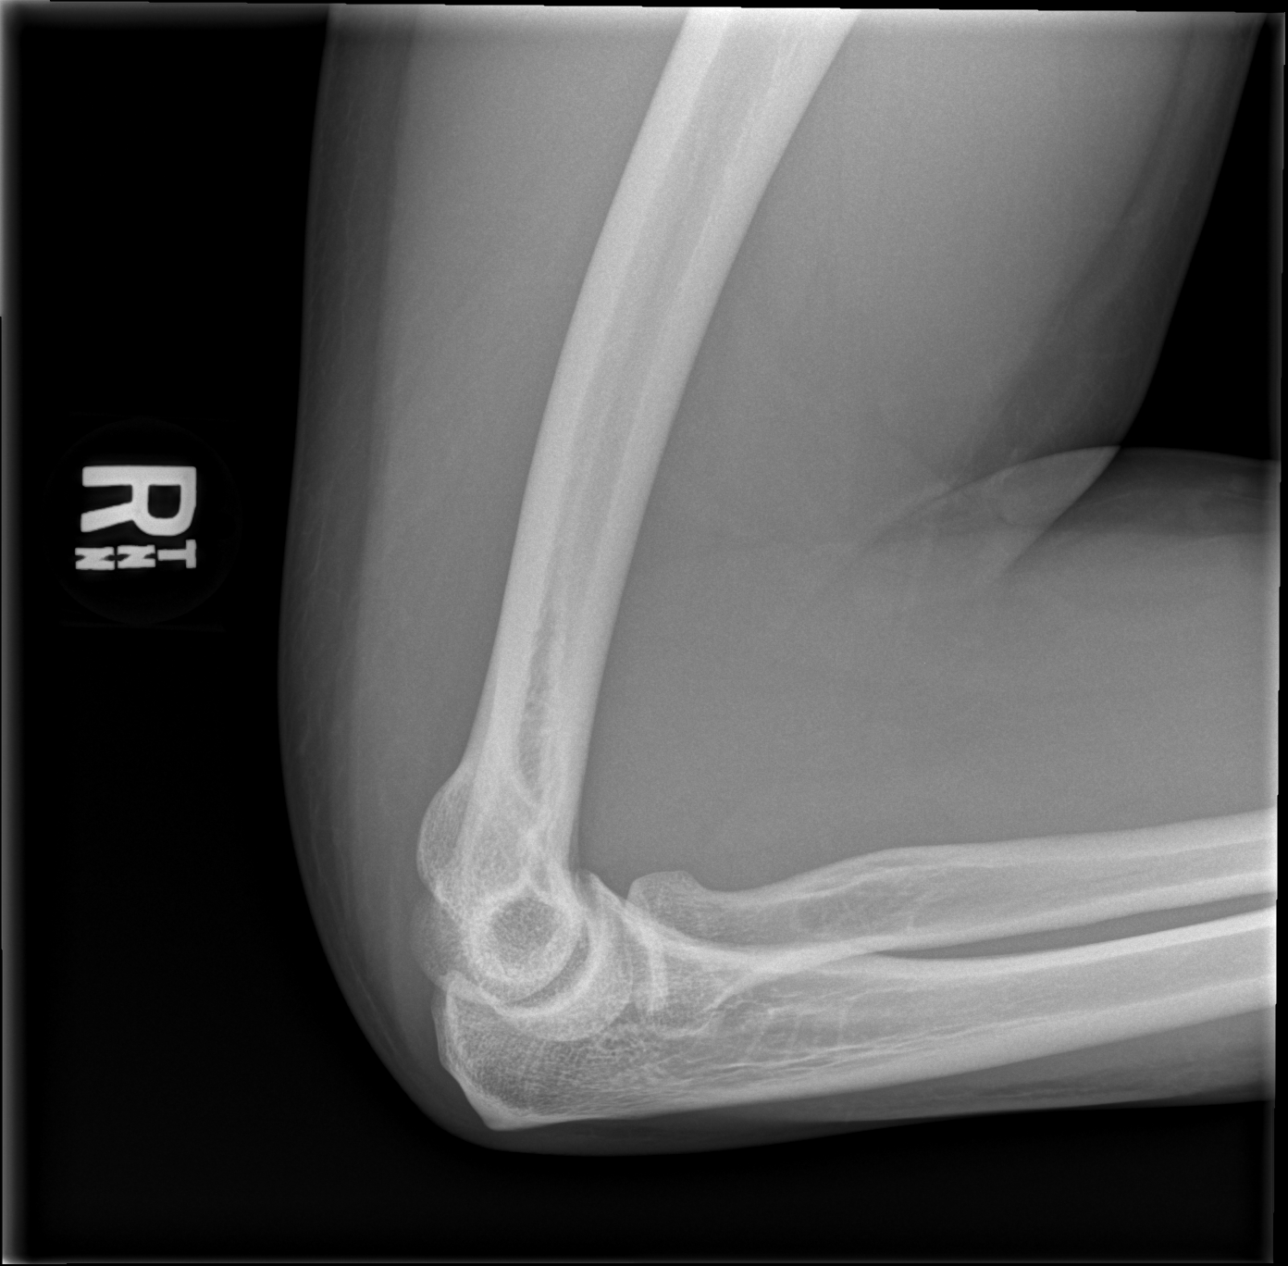

[4 of 4 positions shown; findings below may reference images not displayed]

FINDINGS: There is no evidence of fracture, dislocation, or joint effusion.
There is no evidence of arthropathy or other focal bone abnormality.
Soft tissues are unremarkable.
IMPRESSION: No acute abnormality noted.

## 2017-03-09 ENCOUNTER — Emergency Department (HOSPITAL_COMMUNITY)
Admission: EM | Admit: 2017-03-09 | Discharge: 2017-03-09 | Disposition: A | Discharge status: Home / Self Care | Payer: Medicare HMO | Attending: Emergency Medicine | Admitting: Emergency Medicine

## 2017-03-09 ENCOUNTER — Encounter (HOSPITAL_COMMUNITY): Payer: Self-pay | Admitting: Family Medicine

## 2017-03-09 DIAGNOSIS — J45909 Unspecified asthma, uncomplicated: Secondary | ICD-10-CM | POA: Diagnosis not present

## 2017-03-09 DIAGNOSIS — Z87891 Personal history of nicotine dependence: Secondary | ICD-10-CM | POA: Insufficient documentation

## 2017-03-09 DIAGNOSIS — K649 Unspecified hemorrhoids: Secondary | ICD-10-CM | POA: Diagnosis not present

## 2017-03-09 DIAGNOSIS — K6289 Other specified diseases of anus and rectum: Secondary | ICD-10-CM | POA: Diagnosis present

## 2017-03-09 LAB — CBC
HCT: 40.2 % (ref 39.0–52.0)
Hemoglobin: 13.3 g/dL (ref 13.0–17.0)
MCH: 27 pg (ref 26.0–34.0)
MCHC: 33.1 g/dL (ref 30.0–36.0)
MCV: 81.5 fL (ref 78.0–100.0)
Platelets: 272 10*3/uL (ref 150–400)
RBC: 4.93 MIL/uL (ref 4.22–5.81)
RDW: 15.7 % — AB (ref 11.5–15.5)
WBC: 7.9 10*3/uL (ref 4.0–10.5)

## 2017-03-09 LAB — COMPREHENSIVE METABOLIC PANEL
ALK PHOS: 97 U/L (ref 38–126)
ALT: 40 U/L (ref 17–63)
AST: 28 U/L (ref 15–41)
Albumin: 3.8 g/dL (ref 3.5–5.0)
Anion gap: 7 (ref 5–15)
BUN: 10 mg/dL (ref 6–20)
CO2: 29 mmol/L (ref 22–32)
Calcium: 8.8 mg/dL — ABNORMAL LOW (ref 8.9–10.3)
Chloride: 104 mmol/L (ref 101–111)
Creatinine, Ser: 0.98 mg/dL (ref 0.61–1.24)
GFR calc Af Amer: >60 mL/min (ref >60)
GLUCOSE: 91 mg/dL (ref 65–99)
POTASSIUM: 3.8 mmol/L (ref 3.5–5.1)
Sodium: 140 mmol/L (ref 135–145)
TOTAL PROTEIN: 8.4 g/dL — AB (ref 6.5–8.1)
Total Bilirubin: 0.6 mg/dL (ref 0.3–1.2)

## 2017-03-09 LAB — URINALYSIS, ROUTINE W REFLEX MICROSCOPIC
Bacteria, UA: NONE SEEN
Bilirubin Urine: NEGATIVE
GLUCOSE, UA: NEGATIVE mg/dL
HGB URINE DIPSTICK: NEGATIVE
Ketones, ur: 5 mg/dL — AB
Leukocytes, UA: NEGATIVE
NITRITE: NEGATIVE
PH: 6 (ref 5.0–8.0)
Protein, ur: 30 mg/dL — AB
SPECIFIC GRAVITY, URINE: 1.032 — AB (ref 1.005–1.030)

## 2017-03-09 LAB — LIPASE, BLOOD: Lipase: 20 U/L (ref 11–51)

## 2017-03-09 MED ORDER — IBUPROFEN 800 MG PO TABS: 800.0000 mg | ORAL_TABLET | Freq: Four times a day (QID) | ORAL | 0 refills | Status: DC | PRN

## 2017-03-09 MED ORDER — MORPHINE SULFATE (PF) 4 MG/ML IV SOLN
4.0000 mg | Freq: Once | INTRAVENOUS | Status: AC
  Administered 2017-03-09: 4 mg via INTRAMUSCULAR
  Filled 2017-03-09: qty 1

## 2017-03-09 MED ORDER — HYDROCORTISONE ACETATE 25 MG RE SUPP: 25.0000 mg | Freq: Two times a day (BID) | RECTAL | 0 refills | Status: DC

## 2017-03-09 NOTE — ED Provider Notes (Signed)
[facility]-EMERGENCY DEPT Provider Note   CSN: [id number] Arrival date & time: 03/09/17  1544     History   Chief Complaint Chief Complaint  Patient presents with  . Abdominal Pain    HPI Billey ChangMarcus A Balint is a 26 y.o. male.  HPI   26 year old male presenting for evaluation of rectal discomfort.  Patient reports for the past 6 days he has had discomfort in his rectum.  He describes a heaviness sensation with dull achy pain and rated as 9.5 out of 10.  He noticed having some loose stools with mucus in it.  He has history of external hemorrhoid but he has not felt any hemorrhoid.  He endorsed occasional stabbing pain that is nonradiating.  States after he urinates sometimes it bothers his rectum.  He has tried taking naproxen and ibuprofen without relief.  Further moist he has tried sitz bath with transient relief.  Has tried witch hazel cream, hemorrhoid suppository and cream at home.  He endorsed having increased stress.  He denies having fever, chills, abdominal pain, dysuria, hematuria.  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  He denies any recent antibiotic use.    Past Medical History:  Diagnosis Date  . Asthma   . Juvenile arthritis (HCC)   . Periodic fever syndrome (HCC)     There are no active problems to display for this patient.   Past Surgical History:  Procedure Laterality Date  . EXTERNAL EAR SURGERY    . INNER EAR SURGERY         Home Medications    Prior to Admission medications   Medication Sig Start Date End Date Taking? Authorizing Provider  acetaminophen (TYLENOL) 500 MG tablet Take 1,000 mg by mouth every 6 (six) hours as needed for mild pain or headache.     [provider]  albuterol (PROVENTIL  HFA;VENTOLIN HFA) 108 (90 BASE) MCG/ACT inhaler Inhale 2 puffs into the lungs every 4 (four) hours as needed for wheezing or shortness of breath (cough). Breathing Patient not taking: Reported on 12/05/2015 05/04/13   Piepenbrink, Victorino DikeJennifer, PA-C  HYDROcodone-acetaminophen (NORCO) 5-325 MG tablet Take 1 tablet by mouth every 4 (four) hours as needed. 12/05/15   Mancel BaleWentz, Elliott, MD  ibuprofen (ADVIL,MOTRIN) 200 MG tablet Take 200-600 mg by mouth every 6 (six) hours as needed for headache or moderate pain.    [provider]  morphine (MSIR) 15 MG tablet Take 1 tablet (15 mg total) by mouth every 6 (six) hours as needed for severe pain. 05/03/16   Trixie DredgeWest, Emily, PA-C    Family History History reviewed. No pertinent family history.  Social History Social History   Tobacco Use  . Smoking status: Former Smoker    Types: Cigarettes  . Smokeless tobacco: Never Used  Substance Use Topics  . Alcohol use: No  . Drug use: No     Allergies   Codeine   Review of Systems Review of Systems  All other systems reviewed and are negative.    Physical Exam Updated Vital Signs BP 129/74 (BP Location: Left Arm)   Pulse 71   Temp 98.1 F (36.7 C) (Oral)   Resp 18   Ht 6' (1.829 m)   Wt 111.1 kg (245 lb)  SpO2 97%   BMI 33.23 kg/m   Physical Exam  Constitutional: He appears well-developed and well-nourished. No distress.  HENT:  Head: Atraumatic.  Eyes: Conjunctivae are normal.  Neck: Neck supple.  Cardiovascular: Normal rate and regular rhythm.  Pulmonary/Chest: Effort normal and breath sounds normal.  Abdominal: Soft. He exhibits no distension. There is no tenderness.  Genitourinary:  Genitourinary Comments: Chaperone present during exam.  Nonthrombosed external hemorrhoid noted.  Discomfort with digital rectal exam.  An anal scope was used which demonstrated a bleeding hemorrhoid with mucus within the rectum.  Due to discomfort of these speculum exam, I was unable to fully  visualize the sigmoid colon or much of the anal canal.  No obvious mass noted.  Rectal tone is intact.  Neurological: He is alert.  Skin: No rash noted.  Psychiatric: He has a normal mood and affect.  Nursing note and vitals reviewed.    ED Treatments / Results  Labs (all labs ordered are listed, but only abnormal results are displayed) Labs Reviewed  COMPREHENSIVE METABOLIC PANEL - Abnormal; Notable for the following components:      Result Value   Calcium 8.8 (*)    Total Protein 8.4 (*)    All other components within normal limits  CBC - Abnormal; Notable for the following components:   RDW 15.7 (*)    All other components within normal limits  URINALYSIS, ROUTINE W REFLEX MICROSCOPIC - Abnormal; Notable for the following components:   Specific Gravity, Urine 1.032 (*)    Ketones, ur 5 (*)    Protein, ur 30 (*)    Squamous Epithelial / LPF 0-5 (*)    All other components within normal limits  LIPASE, BLOOD  POC OCCULT BLOOD, ED    EKG  EKG Interpretation None       Radiology No results found.  Procedures Procedures (including critical care time)  Procedure Note:  Anoscopy  Risks benefits alternatives of the procedure given to the patient Verbal Consent obtained Patient placed in the lateral decubitus position Anoscopy performed Findings: bleeding hemorrhoid with some evidence of mucous, no obvious mass.   Patient tolerated procedure with discomfort, unable to fully advance the speculum for complete visualization.     Medications Ordered in ED Medications  morphine 4 MG/ML injection 4 mg (4 mg Intramuscular Given 03/09/17 2123)     Initial Impression / Assessment and Plan / ED Course  I have reviewed the triage vital signs and the nursing notes.  Pertinent labs & imaging results that were available during my care of the patient were reviewed by me and considered in my medical decision making (see chart for details).     BP 119/76 (BP Location: Right  Arm)   Pulse (!) 58   Temp 98 F (36.7 C) (Oral)   Resp 18   Ht 6' (1.829 m)   Wt 111.1 kg (245 lb)   SpO2 100%   BMI 33.23 kg/m    Final Clinical Impressions(s) / ED Diagnoses   Final diagnoses:  Bleeding hemorrhoid    ED Discharge Orders        Ordered    ibuprofen (ADVIL,MOTRIN) 800 MG tablet  Every 6 hours PRN     03/09/17 2144    hydrocortisone (ANUSOL-HC) 25 MG suppository  2 times daily     03/09/17 2144     9:16 PM Pt report having rectal discomfort with rectal bleeding and mucous.  Bleeding is scant and BRBPR.  Has hx of Hemorrhoid  and hx of HIDS.  Pt is well appearing, no abdominal discomfort. Labs are reassuring.  I do not think pt would benefit from advance imaging at this time.  Pt will benefit f/u with GI specialist for further care.  Will prescribe anusol and ibuprofen for his sxs.  Return precaution given.     Fayrene Helperran, Percival Glasheen, PA-C 03/09/17 2242    Linwood DibblesKnapp, Jon, MD 03/10/17 Jacinta [id number]

## 2017-03-09 NOTE — ED Triage Notes (Signed)
Patient reports he is experiencing rectum pain with dirrahea. Symptoms started 4 days ago. He has been using hemorrhoid cream, wipes, and "lose dosage" of Naproxen.

## 2017-03-09 NOTE — Discharge Instructions (Signed)
Please follow up closely with gastroenterologist for further evaluation of your rectal discomfort.  Use anusol for comfort, take ibuprofen as needed for pain.

## 2018-03-13 ENCOUNTER — Other Ambulatory Visit: Payer: Self-pay

## 2018-03-13 ENCOUNTER — Encounter (HOSPITAL_COMMUNITY): Payer: Self-pay | Admitting: Emergency Medicine

## 2018-03-13 DIAGNOSIS — K047 Periapical abscess without sinus: Secondary | ICD-10-CM | POA: Insufficient documentation

## 2018-03-13 DIAGNOSIS — R590 Localized enlarged lymph nodes: Secondary | ICD-10-CM | POA: Insufficient documentation

## 2018-03-13 DIAGNOSIS — K029 Dental caries, unspecified: Secondary | ICD-10-CM | POA: Insufficient documentation

## 2018-03-13 DIAGNOSIS — K0889 Other specified disorders of teeth and supporting structures: Secondary | ICD-10-CM | POA: Diagnosis not present

## 2018-03-13 NOTE — ED Triage Notes (Signed)
Pt presents with a right lower abscessed wisdom tooth. Patient states he noticed it yesterday. Some swelling noted.

## 2018-03-14 ENCOUNTER — Emergency Department (HOSPITAL_COMMUNITY)
Admission: EM | Admit: 2018-03-14 | Discharge: 2018-03-14 | Disposition: A | Discharge status: Home / Self Care | Payer: Medicare HMO | Attending: Emergency Medicine | Admitting: Emergency Medicine

## 2018-03-14 DIAGNOSIS — K0889 Other specified disorders of teeth and supporting structures: Secondary | ICD-10-CM

## 2018-03-14 MED ORDER — PENICILLIN V POTASSIUM 500 MG PO TABS: 500.0000 mg | ORAL_TABLET | Freq: Four times a day (QID) | ORAL | 0 refills | Status: DC

## 2018-03-14 MED ORDER — KETOROLAC TROMETHAMINE 60 MG/2ML IM SOLN
60.0000 mg | Freq: Once | INTRAMUSCULAR | Status: AC
  Administered 2018-03-14: 60 mg via INTRAMUSCULAR
  Filled 2018-03-14: qty 2

## 2018-03-14 MED ORDER — PENICILLIN V POTASSIUM 500 MG PO TABS
500.0000 mg | ORAL_TABLET | Freq: Once | ORAL | Status: AC
Start: 2018-03-14 — End: 2018-03-14
  Administered 2018-03-14: 500 mg via ORAL
  Filled 2018-03-14: qty 1

## 2018-03-14 MED ORDER — NAPROXEN 500 MG PO TABS: 500.0000 mg | ORAL_TABLET | Freq: Two times a day (BID) | ORAL | 0 refills | Status: DC

## 2018-03-14 NOTE — ED Provider Notes (Signed)
[facility]-EMERGENCY DEPT Provider Note   CSN: [id number] Arrival date & time: 03/13/18  2253     History   Chief Complaint Chief Complaint  Patient presents with  . Dental Pain    HPI Rickey Becker is a 27 y.o. male.  The history is provided by the patient and medical records.  Dental Pain       27 y.o. F with hx of asthma, juvenile arthritis, periodic fever syndrome, presenting to the ED with left lower dental pain.  States about a month ago he was due to have his wisdom teeth removed with oral surgery.  He was referred there from the ER at prior visit.  States showed up for his appointment and they told him his insurance was not valid and so his appointment was canceled.  He tried to reschedule with a toy when he needed a new referral.  States yesterday started feeling some swelling of his left lower gums and had a tactile fever today.  He has not had any difficulty swallowing but does have significant pain along the left side of his jaw.  He did take 800 mg of Motrin prior to arrival without much relief.  Past Medical History:  Diagnosis Date  . Asthma   . Juvenile arthritis (HCC)   . Periodic fever syndrome (HCC)     There are no active problems to display for this patient.   Past Surgical History:  Procedure Laterality Date  . EXTERNAL EAR SURGERY    . INNER EAR SURGERY          Home Medications    Prior to Admission medications   Medication Sig Start Date End Date Taking? Authorizing Provider  acetaminophen (TYLENOL) 500 MG tablet Take 1,000 mg by mouth every 6 (six) hours as needed for mild pain or headache.     [provider]  hydrocortisone (ANUSOL-HC) 25 MG suppository Place 1 suppository (25 mg total) 2 (two) times daily rectally. For 7 days 03/09/17   Fayrene Helperran, Bowie, PA-C  ibuprofen (ADVIL,MOTRIN) 800 MG tablet Take 1 tablet (800 mg total) every 6 (six) hours as needed by mouth for moderate pain. 03/09/17   Fayrene Helperran, Bowie,  PA-C  naproxen sodium (ALEVE) 220 MG tablet Take 220 mg 2 (two) times daily as needed by mouth (pain).    [provider]    Family History No family history on file.  Social History Social History   Tobacco Use  . Smoking status: Former Smoker    Types: Cigarettes  . Smokeless tobacco: Never Used  Substance Use Topics  . Alcohol use: No  . Drug use: No     Allergies   Codeine   Review of Systems Review of Systems  HENT: Positive for dental problem.   All other systems reviewed and are negative.    Physical Exam Updated Vital Signs BP 134/87   Pulse (!) 57   Temp 97.9 F (36.6 C) (Oral)   Resp 18   SpO2 100%   Physical Exam  Constitutional: He is oriented to person, place, and time. He appears well-developed and well-nourished.  HENT:  Head: Normocephalic and atraumatic.  Mouth/Throat: Oropharynx is clear and moist.  Teeth largely in fair dentition, few areas of cavities noted, left lower gums have areas of abscess noted that extends somewhat into the left inner cheek, handling secretions appropriately, no trismus, no external facial or neck swelling, normal phonation without stridor  Eyes: Pupils are equal, round, and reactive to  light. Conjunctivae and EOM are normal.  Neck: Normal range of motion.  Cardiovascular: Normal rate, regular rhythm and normal heart sounds.  Pulmonary/Chest: Effort normal and breath sounds normal. No stridor. No respiratory distress.  Abdominal: Soft. Bowel sounds are normal.  Musculoskeletal: Normal range of motion.  Lymphadenopathy:       Head (left side): Submandibular adenopathy present.  Neurological: He is alert and oriented to person, place, and time.  Skin: Skin is warm and dry.  Psychiatric: He has a normal mood and affect.  Nursing note and vitals reviewed.    ED Treatments / Results  Labs (all labs ordered are listed, but only abnormal results are displayed) Labs Reviewed - No data to  display  EKG None  Radiology No results found.  Procedures Procedures (including critical care time)  Medications Ordered in ED Medications  penicillin v potassium (VEETID) tablet 500 mg (500 mg Oral Given 03/14/18 0158)  ketorolac (TORADOL) injection 60 mg (60 mg Intramuscular Given 03/14/18 0200)     Initial Impression / Assessment and Plan / ED Course  I have reviewed the triage vital signs and the nursing notes.  Pertinent labs & imaging results that were available during my care of the patient were reviewed by me and considered in my medical decision making (see chart for details).  27 year old male here with left lower dental pain.  Due to have wisdom teeth removed few weeks ago but miscommunication by his insurance and form was canceled.  Now has what appears to be developing abscess of left lower gums with some extension into the inner cheek.  He has no significant facial or neck swelling, handling secretions well, normal phonation without stridor.  He is afebrile and nontoxic.  Clinically not concerning for Ludwig's angina.  Will start on antibiotics and referred back to oral surgery for follow-up.  He will return here for any new or worsening symptoms.  Final Clinical Impressions(s) / ED Diagnoses   Final diagnoses:  Pain, dental    ED Discharge Orders         Ordered     03/14/18 0211    naproxen (NAPROSYN) 500 MG tablet  2 times daily with meals     03/14/18 0211    penicillin v potassium (VEETID) 500 MG tablet  4 times daily     03/14/18 0212           Garlon Hatchet, PA-C 03/14/18 1610    Wilkie Aye Mayer Masker, MD 03/14/18 [phone]

## 2018-03-14 NOTE — Discharge Instructions (Signed)
Take the prescribed medication as directed. Follow-up with oral surgery to reschedule your wisdom teeth removal-- call Monday morning for appt. Return to the ED for new or worsening symptoms.

## 2018-03-22 DIAGNOSIS — R69 Illness, unspecified: Secondary | ICD-10-CM | POA: Diagnosis not present

## 2018-05-06 ENCOUNTER — Ambulatory Visit: Payer: Medicare HMO | Admitting: Family Medicine

## 2018-06-08 ENCOUNTER — Encounter: Payer: Self-pay | Admitting: Family Medicine

## 2019-05-15 ENCOUNTER — Encounter: Payer: Self-pay | Admitting: Pediatrics

## 2019-05-15 DIAGNOSIS — M041 Periodic fever syndromes: Secondary | ICD-10-CM | POA: Insufficient documentation

## 2019-06-01 ENCOUNTER — Emergency Department (HOSPITAL_COMMUNITY)
Admission: EM | Admit: 2019-06-01 | Discharge: 2019-06-01 | Disposition: A | Discharge status: Home / Self Care | Payer: Medicare HMO | Attending: Emergency Medicine | Admitting: Emergency Medicine

## 2019-06-01 ENCOUNTER — Encounter (HOSPITAL_COMMUNITY): Payer: Self-pay | Admitting: Emergency Medicine

## 2019-06-01 ENCOUNTER — Other Ambulatory Visit: Payer: Self-pay

## 2019-06-01 DIAGNOSIS — Z87891 Personal history of nicotine dependence: Secondary | ICD-10-CM | POA: Diagnosis not present

## 2019-06-01 DIAGNOSIS — W19XXXA Unspecified fall, initial encounter: Secondary | ICD-10-CM

## 2019-06-01 DIAGNOSIS — S0990XA Unspecified injury of head, initial encounter: Secondary | ICD-10-CM | POA: Diagnosis not present

## 2019-06-01 DIAGNOSIS — Y929 Unspecified place or not applicable: Secondary | ICD-10-CM | POA: Diagnosis not present

## 2019-06-01 DIAGNOSIS — W010XXA Fall on same level from slipping, tripping and stumbling without subsequent striking against object, initial encounter: Secondary | ICD-10-CM | POA: Insufficient documentation

## 2019-06-01 DIAGNOSIS — Y999 Unspecified external cause status: Secondary | ICD-10-CM | POA: Diagnosis not present

## 2019-06-01 DIAGNOSIS — Z79899 Other long term (current) drug therapy: Secondary | ICD-10-CM | POA: Diagnosis not present

## 2019-06-01 DIAGNOSIS — Z791 Long term (current) use of non-steroidal anti-inflammatories (NSAID): Secondary | ICD-10-CM | POA: Insufficient documentation

## 2019-06-01 DIAGNOSIS — Y93E5 Activity, floor mopping and cleaning: Secondary | ICD-10-CM | POA: Insufficient documentation

## 2019-06-01 DIAGNOSIS — J45909 Unspecified asthma, uncomplicated: Secondary | ICD-10-CM | POA: Insufficient documentation

## 2019-06-01 MED ORDER — IBUPROFEN 400 MG PO TABS
600.0000 mg | ORAL_TABLET | Freq: Once | ORAL | Status: AC
  Administered 2019-06-01: 600 mg via ORAL
  Filled 2019-06-01: qty 1

## 2019-06-01 NOTE — ED Triage Notes (Signed)
Patient slipped and fell on slippery floor at home this evening denies LOC , ambulatory , alert and oriented x4 , respirations unlabored , reports mild left temporal headache and left facial pain .

## 2019-06-01 NOTE — ED Provider Notes (Signed)
[facility] EMERGENCY DEPARTMENT Provider Note   CSN: [id number] Arrival date & time: 06/01/19  1901     History Chief Complaint  Patient presents with  . Fall    Rickey Becker is a 29 y.o. male with a past medical history significant for asthma, juvenile arthritis, and periodic fever syndrome who presents to the ED after a fall that occurred yesterday around 4 PM.  Patient states he was pressure washing the floor when he slipped and fell on the left side of his body.  Patient admits to hitting the left side of his head.  Denies loss of consciousness.  Patient is not currently on any blood thinners.  Patient denies nausea, vomiting, and changes to vision.  Patient admits to an achy headache that is difficult to pinpoint location.  He rates his pain a 6/10.  He has tried over-the-counter ibuprofen with moderate relief.  Patient also admits to an abrasion to his left shoulder and bruising to left hip.  Patient denies photophobia, dizziness, changes in speech, weakness, and numbness/tingling.  Patient denies all other injuries during fall.    Past Medical History:  Diagnosis Date  . Asthma   . Juvenile arthritis (HCC)   . Periodic fever syndrome [facility])     Patient Active Problem List   Diagnosis Date Noted  . 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 EXTERNAL EAR SURGERY    . INNER EAR SURGERY         No family history on file.  Social History   Tobacco Use  . Smoking status: Former Smoker    Types: Cigarettes  . Smokeless tobacco: Never Used  Substance Use Topics  . Alcohol use: No  . Drug use: No    Home Medications Prior to Admission medications   Medication Sig Start Date End Date Taking? Authorizing Provider  acetaminophen (TYLENOL) 500 MG tablet Take 1,000 mg by mouth every 6 (six) hours as needed for mild pain or headache.     [provider]  hydrocortisone (ANUSOL-HC) 25 MG  suppository Place 1 suppository (25 mg total) 2 (two) times daily rectally. For 7 days 03/09/17   Fayrene Helper, PA-C  ibuprofen (ADVIL,MOTRIN) 800 MG tablet Take 1 tablet (800 mg total) every 6 (six) hours as needed by mouth for moderate pain. 03/09/17   Fayrene Helper, PA-C  naproxen (NAPROSYN) 500 MG tablet Take 1 tablet (500 mg total) by mouth 2 (two) times daily with a meal. 03/14/18   Garlon Hatchet, PA-C  naproxen sodium (ALEVE) 220 MG tablet Take 220 mg 2 (two) times daily as needed by mouth (pain).    [provider]  penicillin v potassium (VEETID) 500 MG tablet Take 1 tablet (500 mg total) by mouth 4 (four) times daily. 03/14/18   Garlon Hatchet, PA-C    Allergies    Codeine  Review of Systems   Review of Systems  Constitutional: Negative for chills and fever.  Eyes: Negative for visual disturbance.  Respiratory: Negative for shortness of breath.   Cardiovascular: Negative for chest pain.  Gastrointestinal: Negative for nausea and vomiting.  Skin: Positive for color change and wound.  Neurological: Positive for headaches. Negative for dizziness, syncope, facial asymmetry, speech difficulty, weakness and numbness.    Physical Exam Updated Vital Signs BP 134/81 (BP Location: Right Arm)   Pulse 63   Temp 97.9 F (36.6 C) (Oral)   Resp 18  SpO2 100%   Physical Exam Vitals and nursing note reviewed.  Constitutional:      General: He is not in acute distress.    Appearance: He is not ill-appearing.  HENT:     Head: Normocephalic.     Comments: Small contusion to left temporal region.  No deformity.  No lacerations.    Ears:     Comments: No hemotympanum bilaterally.  No battle sign.    Nose:     Comments: No septal hematoma bilaterally.    Mouth/Throat:     Comments: Full range of motion of jaw without crepitus. Eyes:     Extraocular Movements: Extraocular movements intact.     Pupils: Pupils are equal, round, and reactive to light.     Comments: No raccoon  eyes.  Neck:     Comments: No cervical midline tenderness.  Full range of motion of neck. Cardiovascular:     Rate and Rhythm: Normal rate and regular rhythm.     Pulses: Normal pulses.     Heart sounds: Normal heart sounds. No murmur. No friction rub. No gallop.   Pulmonary:     Effort: Pulmonary effort is normal.     Breath sounds: Normal breath sounds.  Abdominal:     General: Abdomen is flat. There is no distension.     Palpations: Abdomen is soft.     Tenderness: There is no abdominal tenderness. There is no guarding or rebound.  Musculoskeletal:     Cervical back: Neck supple.     Comments: No T-spine and L-spine midline tenderness, no stepoff or deformity,no paraspinal tenderness No leg edema bilaterally Patient moves all extremities without difficulty. DP/PT pulses 2+ and equal bilaterally Sensation grossly intact bilaterally Able to ambulate without difficulty  Small abrasion to left shoulder with tenderness to palpation.  Full range of motion of left shoulder.  Left upper extremity neurovascularly intact.   Neurological:     General: No focal deficit present.     Mental Status: He is alert.     Comments: Speech is clear, able to follow commands CN III-XII intact Normal strength in upper and lower extremities bilaterally including dorsiflexion and plantar flexion, strong and equal grip strength Sensation grossly intact throughout Moves extremities without ataxia, coordination intact No pronator drift Ambulates without difficulty      ED Results / Procedures / Treatments   Labs (all labs ordered are listed, but only abnormal results are displayed) Labs Reviewed - No data to display  EKG None  Radiology No results found.  Procedures Procedures (including critical care time)  Medications Ordered in ED Medications  ibuprofen (ADVIL) tablet 600 mg (600 mg Oral Given 06/01/19 2110)    ED Course  I have reviewed the triage vital signs and the nursing  notes.  Pertinent labs & imaging results that were available during my care of the patient were reviewed by me and considered in my medical decision making (see chart for details).    MDM Rules/Calculators/A&P                     29 year old male presents to the ED after a fall that occurred yesterday at 4 PM.  Patient admits to hitting the left side of his head.  Denies loss of consciousness.  Patient is not currently on any blood thinners.  Vitals all within normal limits.  Patient in no acute distress and rather well-appearing.  Normal neurological exam. Doubt SAH or other acute intracranial abnormalities. No  signs of basilar skull fracture on exam.  Shared decision making in regards to CT scan.  Will hold off on CT scan at this point following [place] head CT criteria which patient agrees with.  No concern for bony fractures of left shoulder or left hip. Patient able to ambulate in the ED without difficulty. Discussed concussion treatment with patient.  Advised patient to limit screen time.  Patient discharged with neurology follow-up.  Instructed patient to call neurology if headache does not improve within the next week.  Advised patient to take over-the-counter ibuprofen or Tylenol as needed for pain. Strict ED precautions discussed with patient. Patient states understanding and agrees to plan. Patient discharged home in no acute distress and stable vitals. Final Clinical Impression(s) / ED Diagnoses Final diagnoses:  Fall, initial encounter  Injury of head, initial encounter    Rx / DC Orders ED Discharge Orders    None       Karie Kirks 06/01/19 2150    Charlesetta Shanks, MD 06/03/19 [phone]

## 2019-06-01 NOTE — Discharge Instructions (Signed)
As discussed, your exam was completely normal today. You may have had a concussion. I have included information on concussions. I have included the number of the neurologist. If your headache continues for over a week, call to schedule an appointment for further evaluation. You may take over the counter ibuprofen or tylenol as needed for pain. Return to the ER if you develop severe headache, severe nausea/vomiting, changes to your vision or worsening symptoms.

## 2019-08-13 ENCOUNTER — Other Ambulatory Visit: Payer: Self-pay

## 2019-08-13 ENCOUNTER — Emergency Department
Admission: EM | Admit: 2019-08-13 | Discharge: 2019-08-13 | Disposition: A | Discharge status: Home / Self Care | Payer: Medicare HMO | Attending: Student | Admitting: Student

## 2019-08-13 DIAGNOSIS — M0890 Juvenile arthritis, unspecified, unspecified site: Secondary | ICD-10-CM | POA: Diagnosis not present

## 2019-08-13 DIAGNOSIS — Z79899 Other long term (current) drug therapy: Secondary | ICD-10-CM | POA: Insufficient documentation

## 2019-08-13 DIAGNOSIS — R300 Dysuria: Secondary | ICD-10-CM

## 2019-08-13 DIAGNOSIS — R319 Hematuria, unspecified: Secondary | ICD-10-CM | POA: Diagnosis present

## 2019-08-13 DIAGNOSIS — J45909 Unspecified asthma, uncomplicated: Secondary | ICD-10-CM | POA: Diagnosis not present

## 2019-08-13 DIAGNOSIS — R310 Gross hematuria: Secondary | ICD-10-CM | POA: Diagnosis not present

## 2019-08-13 LAB — CHLAMYDIA/NGC RT PCR (ARMC ONLY)
Chlamydia Tr: NOT DETECTED
N gonorrhoeae: NOT DETECTED

## 2019-08-13 LAB — URINALYSIS, COMPLETE (UACMP) WITH MICROSCOPIC
Bilirubin Urine: NEGATIVE
Glucose, UA: NEGATIVE mg/dL
Ketones, ur: NEGATIVE mg/dL
Nitrite: NEGATIVE
Protein, ur: NEGATIVE mg/dL
Specific Gravity, Urine: >1.03 — ABNORMAL HIGH (ref 1.005–1.030)
pH: 6 (ref 5.0–8.0)

## 2019-08-13 MED ORDER — AZITHROMYCIN 500 MG PO TABS
1000.0000 mg | ORAL_TABLET | Freq: Once | ORAL | Status: AC
  Administered 2019-08-13: 21:00:00 1000 mg via ORAL
  Filled 2019-08-13: qty 2

## 2019-08-13 MED ORDER — CEFTRIAXONE SODIUM 250 MG IJ SOLR
250.0000 mg | Freq: Once | INTRAMUSCULAR | Status: DC
  Filled 2019-08-13: qty 250

## 2019-08-13 NOTE — ED Provider Notes (Addendum)
[facility] Emergency Department Provider Note  ____________________________________________  Time seen: Approximately 8:15 PM  I have reviewed the triage vital signs and the nursing notes.   HISTORY  Chief Complaint Blood in urine    HPI Rickey Becker is a 29 y.o. male who presents to the emergency department complaining of hematuria and mild dysuria.  Patient states that symptoms began this evening.  States that he had been relaxing, got up to urinate and noticed some blood in his urine.  Patient states that he does have a history of periodic fever syndrome as a child.  He had had intermittent bloody stools, hematuria on this condition but states that he not had any hematuria in a long time.  Patient has had unprotected sex but states that it is with his steady partner.  Patient denies any fevers or chills, abdominal or pelvic pain.  No flank pain.  No history of kidney stones.         Past Medical History:  Diagnosis Date  . Asthma   . Juvenile arthritis ([place])   . Periodic fever syndrome [facility])     Patient Active Problem List   Diagnosis Date Noted  . 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 EXTERNAL EAR SURGERY    . INNER EAR SURGERY      Prior to Admission medications   Medication Sig Start Date End Date Taking? Authorizing Provider  acetaminophen (TYLENOL) 500 MG tablet Take 1,000 mg by mouth every 6 (six) hours as needed for mild pain or headache.     [provider]  hydrocortisone (ANUSOL-HC) 25 MG suppository Place 1 suppository (25 mg total) 2 (two) times daily rectally. For 7 days 03/09/17   Domenic Moras, PA-C  ibuprofen (ADVIL,MOTRIN) 800 MG tablet Take 1 tablet (800 mg total) every 6 (six) hours as needed by mouth for moderate pain. 03/09/17   Domenic Moras, PA-C  naproxen (NAPROSYN) 500 MG tablet Take 1 tablet (500 mg total) by mouth 2 (two) times daily with a meal. 03/14/18    Larene Pickett, PA-C  naproxen sodium (ALEVE) 220 MG tablet Take 220 mg 2 (two) times daily as needed by mouth (pain).    [provider]  penicillin v potassium (VEETID) 500 MG tablet Take 1 tablet (500 mg total) by mouth 4 (four) times daily. 03/14/18   Larene Pickett, PA-C    Allergies Codeine  No family history on file.  Social History Social History   Tobacco Use  . Smoking status: Former Smoker    Types: Cigarettes  . Smokeless tobacco: Never Used  Substance Use Topics  . Alcohol use: No  . Drug use: No     Review of Systems  Constitutional: No fever/chills Eyes: No visual changes. No discharge ENT: No upper respiratory complaints. Cardiovascular: no chest pain. Respiratory: no cough. No SOB. Gastrointestinal: No abdominal pain.  No nausea, no vomiting.  No diarrhea.  No constipation. Genitourinary: Positive for dysuria.  Positive hematuria Musculoskeletal: Negative for musculoskeletal pain. Skin: Negative for rash, abrasions, lacerations, ecchymosis. Neurological: Negative for headaches, focal weakness or numbness. 10-point ROS otherwise negative.  ____________________________________________   PHYSICAL EXAM:  VITAL SIGNS: ED Triage Vitals  Enc Vitals Group     BP 08/13/19 1924 126/80     Pulse Rate 08/13/19 1924 (!) 58     Resp 08/13/19 1924 18     Temp 08/13/19 1924 98.2 F (36.8 C)  Temp src --      SpO2 08/13/19 1924 99 %     Weight 08/13/19 1922 231 lb (104.8 kg)     Height 08/13/19 1922 6' (1.829 m)     Head Circumference --      Peak Flow --      Pain Score 08/13/19 1922 6     Pain Loc --      Pain Edu? --      Excl. in GC? --      Constitutional: Alert and oriented. Well appearing and in no acute distress. Eyes: Conjunctivae are normal. PERRL. EOMI. Head: Atraumatic. ENT:      Ears:       Nose: No congestion/rhinnorhea.      Mouth/Throat: Mucous membranes are moist.  Neck: No stridor.    Cardiovascular: Normal rate,  regular rhythm. Normal S1 and S2.  Good peripheral circulation. Respiratory: Normal respiratory effort without tachypnea or retractions. Lungs CTAB. Good air entry to the bases with no decreased or absent breath sounds. Gastrointestinal: Bowel sounds 4 quadrants. Soft and nontender to palpation. No guarding or rigidity. No palpable masses. No distention. No CVA tenderness. Genitourinary: Patient defers genital exam.  Reports no lesions. Musculoskeletal: Full range of motion to all extremities. No gross deformities appreciated. Neurologic:  Normal speech and language. No gross focal neurologic deficits are appreciated.  Skin:  Skin is warm, dry and intact. No rash noted. Psychiatric: Mood and affect are normal. Speech and behavior are normal. Patient exhibits appropriate insight and judgement.   ____________________________________________   LABS (all labs ordered are listed, but only abnormal results are displayed)  Labs Reviewed  URINALYSIS, COMPLETE (UACMP) WITH MICROSCOPIC - Abnormal; Notable for the following components:      Result Value   Specific Gravity, Urine >1.030 (*)    Hgb urine dipstick SMALL (*)    Leukocytes,Ua TRACE (*)    Bacteria, UA RARE (*)    All other components within normal limits  CHLAMYDIA/NGC RT PCR ([facility] ONLY)   ____________________________________________  EKG   ____________________________________________  RADIOLOGY   No results found.  ____________________________________________    PROCEDURES  Procedure(s) performed:    Procedures    Medications  cefTRIAXone (ROCEPHIN) injection 250 mg (has no administration in time range)  azithromycin (ZITHROMAX) tablet 1,000 mg (has no administration in time range)     ____________________________________________   INITIAL IMPRESSION / ASSESSMENT AND PLAN / ED COURSE  Pertinent labs & imaging results that were available during my care of the patient were reviewed by me and  considered in my medical decision making (see chart for details).  Review of the [place] CSRS was performed in accordance of the [facility] prior to dispensing any controlled drugs.           Patient's diagnosis is consistent with dysuria, hematuria.  Patient presented to emergency department complaining of burning with urination and mild hematuria.  Patient states that he has a history of periodic fever syndrome as a kid.  He had had intermittent hematuria and bloody stools from this condition.  Patient states that he is not had symptoms in a long time.  Patient states that he does drink a lot more caffeine than normal.  He has had unprotected sex but with a long-term partner.  Differential included nongonococcal urethritis, gonorrhea or chlamydia, as well as patient's long-term medical problem.  Patient with some red blood cells, white blood cells and a few bacteria were identified on urinalysis.  Patient will be treated  with Rocephin, azithromycin.  Gonorrhea and Chlamydia testing have not returned at this time.  I discussed the differential with the patient.  Follow-up with primary care or urology if symptoms do not improve after medications here in the emergency department..  Patient is given ED precautions to return to the ED for any worsening or new symptoms.  Addendum: Patient declined Rocephin injection.  Patient states that he really does not believe there is any chance for gonorrhea or chlamydia.  I explained that refusing the Rocephin injection covered other bacteria other than just gonorrhea.  Patient verbalizes understanding but states that he feels that it is likely his chronic medical problem versus a true infection.  I have advised the patient that if he does receive positive gonorrhea or chlamydia tests he needs to return for the injection.  Patient verbalizes understanding of same.  Patient will follow-up primary care or urology if symptoms are not  improving.   ____________________________________________  FINAL CLINICAL IMPRESSION(S) / ED DIAGNOSES  Final diagnoses:  Dysuria  Gross hematuria      NEW MEDICATIONS STARTED DURING THIS VISIT:  ED Discharge Orders    None          This chart was dictated using voice recognition software/Dragon. Despite best efforts to proofread, errors can occur which can change the meaning. Any change was purely unintentional.    Racheal Patches, PA-C 08/13/19 2028    Samiyah Stupka, Delorise Royals, PA-C 08/13/19 2055    Miguel Aschoff., MD 08/14/19 1100

## 2019-08-13 NOTE — ED Triage Notes (Signed)
Pt comes via POV from home with c/o blood in urine. Pt states about 15 minutes ago he went to bathroom and noticed burning pain and some blood in his urine.

## 2019-08-13 NOTE — ED Notes (Signed)
Pt states he doesn't want the "antibiotic shot." EDP notified, awaiting response.

## 2019-09-16 ENCOUNTER — Other Ambulatory Visit: Payer: Self-pay

## 2019-09-16 ENCOUNTER — Ambulatory Visit: Payer: Medicare HMO | Attending: Oncology

## 2019-09-16 NOTE — Progress Notes (Signed)
   Covid-19 Vaccination Clinic  Name:  RAYMONE PEMBROKE    MRN: [id number] DOB: [birth year]  09/16/2019  Mr. Vieau was observed post Covid-19 immunization for 15 minutes without incident. He was provided with Vaccine Information Sheet and instruction to access the V-Safe system.   Mr. Urbas was instructed to call 911 with any severe reactions post vaccine: Marland Kitchen Difficulty breathing  . Swelling of face and throat  . A fast heartbeat  . A bad rash all over body  . Dizziness and weakness   Immunizations Administered    Name Date Dose VIS Date Route   Pfizer COVID-19 Vaccine 09/16/2019 10:08 AM 0.3 mL 06/15/2018 Intramuscular   Manufacturer: [employer], [employer]   Lot: [id number]   NDC: [address]-8

## 2019-10-11 ENCOUNTER — Ambulatory Visit: Payer: Medicare HMO | Attending: Internal Medicine

## 2019-10-11 DIAGNOSIS — Z23 Encounter for immunization: Secondary | ICD-10-CM

## 2019-10-11 NOTE — Progress Notes (Signed)
   Covid-19 Vaccination Clinic  Name:  Rickey Becker    MRN: [id number] DOB: 1991/04/13  10/11/2019  [name] was observed post Covid-19 immunization for 15 minutes without incident. He was provided with Vaccine Information Sheet and instruction to access the V-Safe system.   [name] was instructed to call 911 with any severe reactions post vaccine: Marland Kitchen Difficulty breathing  . Swelling of face and throat  . A fast heartbeat  . A bad rash all over body  . Dizziness and weakness   Immunizations Administered    Name Date Dose VIS Date Route   Pfizer COVID-19 Vaccine 10/11/2019  8:44 AM 0.3 mL 06/15/2018 Intramuscular   Manufacturer: [employer], [employer]   Lot: [id number]   NDC: [address]-0

## 2019-12-29 ENCOUNTER — Other Ambulatory Visit: Payer: Self-pay

## 2019-12-30 ENCOUNTER — Encounter: Payer: Self-pay | Admitting: Family Medicine

## 2019-12-30 ENCOUNTER — Ambulatory Visit (INDEPENDENT_AMBULATORY_CARE_PROVIDER_SITE_OTHER): Payer: Medicare HMO | Admitting: Family Medicine

## 2019-12-30 VITALS — BP 122/68 | HR 82 | Temp 98.7°F | Ht 71.0 in | Wt 244.4 lb

## 2019-12-30 DIAGNOSIS — Z0000 Encounter for general adult medical examination without abnormal findings: Secondary | ICD-10-CM | POA: Diagnosis not present

## 2019-12-30 DIAGNOSIS — R8281 Pyuria: Secondary | ICD-10-CM | POA: Diagnosis not present

## 2019-12-30 DIAGNOSIS — M041 Periodic fever syndromes: Secondary | ICD-10-CM

## 2019-12-30 NOTE — Progress Notes (Addendum)
New Patient Office Visit  Subjective:  Patient ID: Rickey Becker, male    DOB: [birth year]  Age: 29 y.o. MRN: [id number]  CC:  Chief Complaint  Patient presents with  . Establish Care    New patient,     HPI ALANO BLASCO presents for establishment of care with a physical exam and discussion of his chronic illness.  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  Manifested with relapsing fevers associated with polyarthritis abdominal pain and fatigue.  He produces sterile boils.  It is treated symptomatically.  He has been disabled since age 42.  He works part-time for [employer].  When his illness is quiescent or in remission, he is as healthy as an ox he tells me.  Past Medical History:  Diagnosis Date  . Asthma   . Juvenile arthritis (HCC)   . Periodic fever syndrome [facility])     Past Surgical History:  Procedure Laterality Date  . EXTERNAL EAR SURGERY    . INNER EAR SURGERY      Family History  Problem Relation Age of Onset  . Hypertension Mother     Social History   Socioeconomic History  . Marital status: Single    Spouse name: Not on file  . Number of children: Not on file  . Years of education: Not on file  . Highest education level: Not on file  Occupational History  . Not on file  Tobacco Use  . Smoking status: Former Smoker    Types: Cigarettes  . Smokeless tobacco: Never Used  Vaping Use  . Vaping Use: Never used  Substance and Sexual Activity  . Alcohol use: Yes    Alcohol/week: 2.0 standard drinks    Types: 1 Cans of beer, 1 Shots of liquor per week    Comment: social  . Drug use: No  . Sexual activity: Not Currently    Birth control/protection: Condom  Other Topics Concern  . Not on file  Social History Narrative  . Not on file   Social Determinants of Health   Financial Resource Strain:   . Difficulty of Paying Living Expenses: Not on file  Food Insecurity:   . Worried About Patent examiner in the Last Year: Not on file  . Ran Out of Food in the Last Year: Not on file  Transportation Needs:   . Lack of Transportation (Medical): Not on file  . Lack of Transportation (Non-Medical): Not on file  Physical Activity:   . Days of Exercise per Week: Not on file  . Minutes of Exercise per Session: Not on file  Stress:   . Feeling of Stress : Not on file  Social Connections:   . Frequency of Communication with Friends and Family: Not on file  . Frequency of Social Gatherings with Friends and Family: Not on file  . Attends Religious Services: Not on file  . Active Member of Clubs or Organizations: Not on file  . Attends Banker Meetings: Not on file  . Marital Status: Not on file  Intimate Partner Violence:   . Fear of Current or Ex-Partner: Not on file  . Emotionally Abused: Not on file  . Physically Abused: Not on file  . Sexually Abused: Not on file    ROS Review of Systems  Constitutional: Negative.   HENT: Negative.   Eyes: Negative for photophobia and visual disturbance.  Respiratory: Negative.   Cardiovascular: Negative.  Gastrointestinal: Negative.   Endocrine: Negative for polyphagia and polyuria.  Genitourinary: Negative for difficulty urinating, dysuria, frequency, hematuria and urgency.  Musculoskeletal: Negative for gait problem.  Skin: Negative for color change and pallor.  Allergic/Immunologic: Negative for immunocompromised state.  Neurological: Negative for light-headedness.  Hematological: Does not bruise/bleed easily.  Psychiatric/Behavioral: Negative.     Objective:   Today's Vitals: BP 122/68   Pulse 82   Temp 98.7 F (37.1 C) (Tympanic)   Ht 5\' 11"  (1.803 m)   Wt 244 lb 6.4 oz (110.9 kg)   SpO2 96%   BMI 34.09 kg/m   Physical Exam Vitals and nursing note reviewed.  Constitutional:      General: He is not in acute distress.    Appearance: Normal appearance. He is not ill-appearing, toxic-appearing or diaphoretic.   HENT:     Head: Normocephalic and atraumatic.     Right Ear: Tympanic membrane, ear canal and external ear normal.     Left Ear: Tympanic membrane, ear canal and external ear normal.  Eyes:     General: No scleral icterus.       Right eye: No discharge.        Left eye: No discharge.     Extraocular Movements: Extraocular movements intact.     Conjunctiva/sclera: Conjunctivae normal.     Pupils: Pupils are equal, round, and reactive to light.  Cardiovascular:     Rate and Rhythm: Normal rate and regular rhythm.  Pulmonary:     Effort: Pulmonary effort is normal.     Breath sounds: Normal breath sounds.  Abdominal:     General: Bowel sounds are normal. There is no distension.     Palpations: There is no mass.     Tenderness: There is no abdominal tenderness. There is no guarding.     Hernia: No hernia is present. There is no hernia in the left inguinal area or right inguinal area.  Genitourinary:    Penis: No hypospadias, erythema, tenderness, discharge, swelling or lesions.      Testes:        Right: Mass, tenderness or swelling not present. Right testis is descended.        Left: Mass, tenderness or swelling not present. Left testis is descended.     Epididymis:     Right: Not inflamed or enlarged.     Left: Not inflamed or enlarged.  Musculoskeletal:     Cervical back: Normal range of motion and neck supple. No rigidity or tenderness.     Right lower leg: No edema.     Left lower leg: No edema.  Lymphadenopathy:     Cervical: No cervical adenopathy.     Lower Body: No right inguinal adenopathy. No left inguinal adenopathy.  Skin:    General: Skin is warm and dry.       Neurological:     Mental Status: He is alert and oriented to person, place, and time.  Psychiatric:        Mood and Affect: Mood normal.        Behavior: Behavior normal.     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 acetaminophen (TYLENOL) 500 MG tablet Take 1,000 mg by mouth every 6 (six) hours as needed for mild pain or headache.   . [DISCONTINUED] hydrocortisone (ANUSOL-HC) 25 MG suppository Place 1 suppository (25 mg total) 2 (two) times daily rectally. For 7 days (Patient not taking: Reported on 12/30/2019)  . [DISCONTINUED] ibuprofen (ADVIL,MOTRIN) 800 MG tablet Take 1 tablet (800 mg total) every 6 (six) hours as needed by mouth for moderate pain. (Patient not taking: Reported on 12/30/2019)  . [DISCONTINUED] naproxen (NAPROSYN) 500 MG tablet Take 1 tablet (500 mg total) by mouth 2 (two) times daily with a meal. (Patient not taking: Reported on 12/30/2019)  . [DISCONTINUED] naproxen sodium (ALEVE) 220 MG tablet Take 220 mg 2 (two) times daily as needed by mouth (pain). (Patient not taking: Reported on 12/30/2019)  . [DISCONTINUED] penicillin v potassium (VEETID) 500 MG tablet Take 1 tablet (500 mg total) by mouth 4 (four) times daily. (Patient not taking: Reported on 12/30/2019)   No facility-administered encounter medications on file as of 12/30/2019.    Follow-up: Return in about 6 months (around 06/28/2020), or Return fasting for above ordered blood work..  Agreed to fill out FMLA forms to limit work days to 4 to 5 hours.  Given information on health maintenance and disease prevention. Mliss Sax, MD

## 2019-12-30 NOTE — Patient Instructions (Signed)
Preventive Care 19-30 Years Old, Male Preventive care refers to lifestyle choices and visits with your health care provider that can promote health and wellness. This includes:  A yearly physical exam. This is also called an annual well check.  Regular dental and eye exams.  Immunizations.  Screening for certain conditions.  Healthy lifestyle choices, such as eating a healthy diet, getting regular exercise, not using drugs or products that contain nicotine and tobacco, and limiting alcohol use. What can I expect for my preventive care visit? Physical exam Your health care provider will check:  Height and weight. These may be used to calculate body mass index (BMI), which is a measurement that tells if you are at a healthy weight.  Heart rate and blood pressure.  Your skin for abnormal spots. Counseling Your health care provider may ask you questions about:  Alcohol, tobacco, and drug use.  Emotional well-being.  Home and relationship well-being.  Sexual activity.  Eating habits.  Work and work Statistician. What immunizations do I need?  Influenza (flu) vaccine  This is recommended every year. Tetanus, diphtheria, and pertussis (Tdap) vaccine  You may need a Td booster every 10 years. Varicella (chickenpox) vaccine  You may need this vaccine if you have not already been vaccinated. Human papillomavirus (HPV) vaccine  If recommended by your health care provider, you may need three doses over 6 months. Measles, mumps, and rubella (MMR) vaccine  You may need at least one dose of MMR. You may also need a second dose. Meningococcal conjugate (MenACWY) vaccine  One dose is recommended if you are 45-76 years old and a Market researcher living in a residence hall, or if you have one of several medical conditions. You may also need additional booster doses. Pneumococcal conjugate (PCV13) vaccine  You may need this if you have certain conditions and were not  previously vaccinated. Pneumococcal polysaccharide (PPSV23) vaccine  You may need one or two doses if you smoke cigarettes or if you have certain conditions. Hepatitis A vaccine  You may need this if you have certain conditions or if you travel or work in places where you may be exposed to hepatitis A. Hepatitis B vaccine  You may need this if you have certain conditions or if you travel or work in places where you may be exposed to hepatitis B. Haemophilus influenzae type b (Hib) vaccine  You may need this if you have certain risk factors. You may receive vaccines as individual doses or as more than one vaccine together in one shot (combination vaccines). Talk with your health care provider about the risks and benefits of combination vaccines. What tests do I need? Blood tests  Lipid and cholesterol levels. These may be checked every 5 years starting at age 17.  Hepatitis C test.  Hepatitis B test. Screening   Diabetes screening. This is done by checking your blood sugar (glucose) after you have not eaten for a while (fasting).  Sexually transmitted disease (STD) testing. Talk with your health care provider about your test results, treatment options, and if necessary, the need for more tests. Follow these instructions at home: Eating and drinking   Eat a diet that includes fresh fruits and vegetables, whole grains, lean protein, and low-fat dairy products.  Take vitamin and mineral supplements as recommended by your health care provider.  Do not drink alcohol if your health care provider tells you not to drink.  If you drink alcohol: ? Limit how much you have to 0-2  drinks a day. ? Be aware of how much alcohol is in your drink. In the [place] one drink equals one 12 oz bottle of beer (355 mL), one 5 oz glass of wine (148 mL), or one 1 oz glass of hard liquor (44 mL). Lifestyle  Take daily care of your teeth and gums.  Stay active. Exercise for at least 30 minutes on 5 or  more days each week.  Do not use any products that contain nicotine or tobacco, such as cigarettes, e-cigarettes, and chewing tobacco. If you need help quitting, ask your health care provider.  If you are sexually active, practice safe sex. Use a condom or other form of protection to prevent STIs (sexually transmitted infections). What's next?  Go to your health care provider once a year for a well check visit.  Ask your health care provider how often you should have your eyes and teeth checked.  Stay up to date on all vaccines. This information is not intended to replace advice given to you by your health care provider. Make sure you discuss any questions you have with your health care provider. Document Revised: 04/01/2018 Document Reviewed: 04/01/2018 Elsevier Patient Education  2020 [employer].

## 2020-01-02 ENCOUNTER — Other Ambulatory Visit: Payer: Self-pay

## 2020-01-03 ENCOUNTER — Other Ambulatory Visit (INDEPENDENT_AMBULATORY_CARE_PROVIDER_SITE_OTHER): Payer: Medicare HMO

## 2020-01-03 DIAGNOSIS — M041 Periodic fever syndromes: Secondary | ICD-10-CM

## 2020-01-03 DIAGNOSIS — Z0000 Encounter for general adult medical examination without abnormal findings: Secondary | ICD-10-CM

## 2020-01-03 LAB — COMPREHENSIVE METABOLIC PANEL
ALT: 34 U/L (ref 0–53)
AST: 34 U/L (ref 0–37)
Albumin: 4 g/dL (ref 3.5–5.2)
Alkaline Phosphatase: 76 U/L (ref 39–117)
BUN: 10 mg/dL (ref 6–23)
CO2: 29 mEq/L (ref 19–32)
Calcium: 8.8 mg/dL (ref 8.4–10.5)
Chloride: 102 mEq/L (ref 96–112)
Creatinine, Ser: 0.89 mg/dL (ref 0.40–1.50)
GFR: 121.32 mL/min (ref >60.00)
Glucose, Bld: 92 mg/dL (ref 70–99)
Potassium: 3.9 mEq/L (ref 3.5–5.1)
Sodium: 139 mEq/L (ref 135–145)
Total Bilirubin: 0.8 mg/dL (ref 0.2–1.2)
Total Protein: 7.7 g/dL (ref 6.0–8.3)

## 2020-01-03 LAB — URINALYSIS, ROUTINE W REFLEX MICROSCOPIC
Bilirubin Urine: NEGATIVE
Hgb urine dipstick: NEGATIVE
Ketones, ur: NEGATIVE
Leukocytes,Ua: NEGATIVE
Nitrite: NEGATIVE
Specific Gravity, Urine: >=1.03 — AB (ref 1.000–1.030)
Total Protein, Urine: NEGATIVE
Urine Glucose: NEGATIVE
Urobilinogen, UA: 0.2 (ref 0.0–1.0)
pH: 6.5 (ref 5.0–8.0)

## 2020-01-03 LAB — SEDIMENTATION RATE: Sed Rate: 55 mm/hr — ABNORMAL HIGH (ref 0–15)

## 2020-01-03 LAB — CBC
HCT: 43.3 % (ref 39.0–52.0)
Hemoglobin: 14.1 g/dL (ref 13.0–17.0)
MCHC: 32.6 g/dL (ref 30.0–36.0)
MCV: 83.8 fl (ref 78.0–100.0)
Platelets: 205 10*3/uL (ref 150.0–400.0)
RBC: 5.17 Mil/uL (ref 4.22–5.81)
RDW: 17.1 % — ABNORMAL HIGH (ref 11.5–15.5)
WBC: 5.7 10*3/uL (ref 4.0–10.5)

## 2020-01-03 LAB — LIPID PANEL
Cholesterol: 141 mg/dL (ref 0–200)
HDL: 50.1 mg/dL (ref >39.00)
LDL Cholesterol: 77 mg/dL (ref 0–99)
NonHDL: 90.75
Total CHOL/HDL Ratio: 3
Triglycerides: 67 mg/dL (ref 0.0–149.0)
VLDL: 13.4 mg/dL (ref 0.0–40.0)

## 2020-01-03 LAB — C-REACTIVE PROTEIN: CRP: 1.9 mg/dL (ref 0.5–20.0)

## 2020-01-03 LAB — LDL CHOLESTEROL, DIRECT: Direct LDL: 80 mg/dL

## 2020-01-04 ENCOUNTER — Telehealth: Payer: Self-pay | Admitting: Family Medicine

## 2020-01-04 NOTE — Telephone Encounter (Signed)
Pt request call back to go over labs, please advise CB: [phone]

## 2020-01-04 NOTE — Telephone Encounter (Signed)
Patient calling to go over lab results. Informed patient that they have not been viewed just yet but he will receive a call when labs are viewed.

## 2020-01-05 DIAGNOSIS — R8281 Pyuria: Secondary | ICD-10-CM | POA: Insufficient documentation

## 2020-01-05 NOTE — Addendum Note (Signed)
Addended by: Nadene Rubins A on: 01/05/2020 12:10 PM   Modules accepted: Orders

## 2020-01-05 NOTE — Addendum Note (Signed)
Addended by: Andrez Grime on: 01/05/2020 07:49 AM   Modules accepted: Orders

## 2020-01-09 ENCOUNTER — Ambulatory Visit: Payer: Medicare HMO | Admitting: Nurse Practitioner

## 2020-01-10 LAB — HEPATITIS C ANTIBODY
Hepatitis C Ab: NONREACTIVE
SIGNAL TO CUT-OFF: 0.38 (ref <1.00)

## 2020-01-10 LAB — IGD: IgD: 908 mg/L — ABNORMAL HIGH (ref <179)

## 2020-01-13 ENCOUNTER — Other Ambulatory Visit: Payer: Medicare HMO

## 2020-01-16 ENCOUNTER — Ambulatory Visit: Payer: Medicare HMO | Admitting: Family Medicine

## 2020-01-30 ENCOUNTER — Encounter: Payer: Self-pay | Admitting: Family Medicine

## 2020-01-30 ENCOUNTER — Other Ambulatory Visit: Payer: Self-pay

## 2020-01-30 ENCOUNTER — Ambulatory Visit (INDEPENDENT_AMBULATORY_CARE_PROVIDER_SITE_OTHER): Payer: Medicare HMO | Admitting: Family Medicine

## 2020-01-30 VITALS — BP 128/78 | HR 78 | Temp 98.4°F | Ht 71.0 in | Wt 236.4 lb

## 2020-01-30 DIAGNOSIS — M041 Periodic fever syndromes: Secondary | ICD-10-CM

## 2020-01-30 DIAGNOSIS — J4540 Moderate persistent asthma, uncomplicated: Secondary | ICD-10-CM | POA: Diagnosis not present

## 2020-01-30 MED ORDER — FLUTICASONE-SALMETEROL 250-50 MCG/DOSE IN AEPB: 1.0000 | INHALATION_SPRAY | Freq: Two times a day (BID) | RESPIRATORY_TRACT | 3 refills | Status: DC

## 2020-01-30 MED ORDER — ALBUTEROL SULFATE HFA 108 (90 BASE) MCG/ACT IN AERS: 2.0000 | INHALATION_SPRAY | Freq: Four times a day (QID) | RESPIRATORY_TRACT | 1 refills | Status: DC | PRN

## 2020-01-30 NOTE — Progress Notes (Signed)
Established Patient Office Visit  Subjective:  Patient ID: Rickey Becker, male    DOB: Aug 24, 1990  Age: 29 y.o. MRN: [id number]  CC:  Chief Complaint  Patient presents with   Advice Only    discuss labs    HPI Rickey Becker presents for treatment of his asthma.  Asthma is exacerbated by heavy work such as what he does have headaches.  He wheezes most every day when he is working.  Again with a HID syndrome patient develops fevers abdominal pain arthralgias skin lesions hematuria and hematochezia.  Symptoms resolved after 3 to 4 days.  He uses fluids Tylenol and Advil.  He has not been sexually active over a year.  He lives with his daughter.  Declines the flu vaccine today out of fear that it may precipitate his usual symptoms associated with the syndrome.  Both Covid vaccines led to his typical symptoms of the syndrome he tells me.   Past Medical History:  Diagnosis Date   Asthma    Juvenile arthritis (HCC)    Periodic fever syndrome (HCC)     Past Surgical History:  Procedure Laterality Date   EXTERNAL EAR SURGERY     INNER EAR SURGERY      Family History  Problem Relation Age of Onset   Hypertension Mother     Social History   Socioeconomic History   Marital status: Single    Spouse name: Not on file   Number of children: Not on file   Years of education: Not on file   Highest education level: Not on file  Occupational History   Not on file  Tobacco Use   Smoking status: Former Smoker    Types: Cigarettes   Smokeless tobacco: Never Used  Vaping Use   Vaping Use: Never used  Substance and Sexual Activity   Alcohol use: Yes    Alcohol/week: 2.0 standard drinks    Types: 1 Cans of beer, 1 Shots of liquor per week    Comment: social   Drug use: No   Sexual activity: Not Currently    Birth control/protection: Condom  Other Topics Concern   Not on file  Social History Narrative   Not on file   Social Determinants of Health    Financial Resource Strain:    Difficulty of Paying Living Expenses: Not on file  Food Insecurity:    Worried About Programme researcher, broadcasting/film/video in the Last Year: Not on file   [employer] of Food in the Last Year: Not on file  Transportation Needs:    Lack of Transportation (Medical): Not on file   Lack of Transportation (Non-Medical): Not on file  Physical Activity:    Days of Exercise per Week: Not on file   Minutes of Exercise per Session: Not on file  Stress:    Feeling of Stress : Not on file  Social Connections:    Frequency of Communication with Friends and Family: Not on file   Frequency of Social Gatherings with Friends and Family: Not on file   Attends Religious Services: Not on file   Active Member of Clubs or Organizations: Not on file   Attends Banker Meetings: Not on file   Marital Status: Not on file  Intimate Partner Violence:    Fear of Current or Ex-Partner: Not on file   Emotionally Abused: Not on file   Physically Abused: Not on file   Sexually Abused: Not on file    Outpatient  Medications Prior to Visit  Medication Sig Dispense Refill   acetaminophen (TYLENOL) 500 MG tablet Take 1,000 mg by mouth every 6 (six) hours as needed for mild pain or headache.      No facility-administered medications prior to visit.    Allergies  Allergen Reactions   Codeine Hives    ROS Review of Systems  Constitutional: Negative.   HENT: Negative.   Eyes: Negative for photophobia and visual disturbance.  Respiratory: Negative for chest tightness, shortness of breath and wheezing.   Cardiovascular: Negative.   Gastrointestinal: Negative.  Negative for anal bleeding and blood in stool.  Endocrine: Negative for polyphagia and polyuria.  Genitourinary: Negative for hematuria.  Musculoskeletal: Negative for arthralgias.  Skin: Negative for rash and wound.  Neurological: Negative.   Psychiatric/Behavioral: Negative.       Objective:     Physical Exam Vitals and nursing note reviewed.  Constitutional:      General: He is not in acute distress.    Appearance: Normal appearance. He is not ill-appearing, toxic-appearing or diaphoretic.  HENT:     Head: Normocephalic and atraumatic.     Right Ear: External ear normal.     Left Ear: External ear normal.  Eyes:     General: No scleral icterus.       Right eye: No discharge.        Left eye: No discharge.     Conjunctiva/sclera: Conjunctivae normal.  Cardiovascular:     Rate and Rhythm: Normal rate and regular rhythm.  Pulmonary:     Effort: Pulmonary effort is normal. No respiratory distress.     Breath sounds: Normal breath sounds. No wheezing.  Musculoskeletal:     Cervical back: No rigidity or tenderness.  Lymphadenopathy:     Cervical: No cervical adenopathy.  Skin:    General: Skin is warm and dry.  Neurological:     Mental Status: He is alert and oriented to person, place, and time.  Psychiatric:        Mood and Affect: Mood normal.        Behavior: Behavior normal.     BP 128/78    Pulse 78    Temp 98.4 F (36.9 C) (Tympanic)    Ht 5\' 11"  (1.803 m)    Wt 236 lb 6.4 oz (107.2 kg)    SpO2 96%    BMI 32.97 kg/m  Wt Readings from Last 3 Encounters:  01/30/20 236 lb 6.4 oz (107.2 kg)  12/30/19 244 lb 6.4 oz (110.9 kg)  08/13/19 231 lb (104.8 kg)     Health Maintenance Due  Topic Date Due   TETANUS/TDAP  Never done    There are no preventive care reminders to display for this patient.  No results found for: TSH Lab Results  Component Value Date   WBC 5.7 01/03/2020   HGB 14.1 01/03/2020   HCT 43.3 01/03/2020   MCV 83.8 01/03/2020   PLT 205.0 01/03/2020   Lab Results  Component Value Date   NA 139 01/03/2020   K 3.9 01/03/2020   CO2 29 01/03/2020   GLUCOSE 92 01/03/2020   BUN 10 01/03/2020   CREATININE 0.89 01/03/2020   BILITOT 0.8 01/03/2020   ALKPHOS 76 01/03/2020   AST 34 01/03/2020   ALT 34 01/03/2020   PROT 7.7 01/03/2020    ALBUMIN 4.0 01/03/2020   CALCIUM 8.8 01/03/2020   ANIONGAP 7 03/09/2017   GFR 121.32 01/03/2020   Lab Results  Component Value Date  CHOL 141 01/03/2020   Lab Results  Component Value Date   HDL 50.10 01/03/2020   Lab Results  Component Value Date   LDLCALC 77 01/03/2020   Lab Results  Component Value Date   TRIG 67.0 01/03/2020   Lab Results  Component Value Date   CHOLHDL 3 01/03/2020   No results found for: HGBA1C    Assessment & Plan:   Problem List Items Addressed This Visit      Respiratory   Moderate persistent asthma without complication   Relevant Medications   Fluticasone-Salmeterol (ADVAIR DISKUS) 250-50 MCG/DOSE AEPB   albuterol (VENTOLIN HFA) 108 (90 Base) MCG/ACT inhaler     Other   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    Dispense:  1 each    Refill:  3   albuterol (VENTOLIN HFA) 108 (90 Base) MCG/ACT inhaler    Sig: Inhale 2 puffs into the lungs every 6 (six) hours as needed for wheezing or shortness of breath.    Dispense:  18 g    Refill:  1    Follow-up: Return in about 4 years (around 01/30/2024).   Will consider rheumatology consultation. Mliss Sax, MD

## 2020-01-30 NOTE — Patient Instructions (Signed)
http://www.aaaai.org/conditions-and-treatments/asthma">  Asthma, Adult  Asthma is a long-term (chronic) condition that causes recurrent episodes in which the airways become tight and narrow. The airways are the passages that lead from the nose and mouth down into the lungs. Asthma episodes, also called asthma attacks, can cause coughing, wheezing, shortness of breath, and chest pain. The airways can also fill with mucus. During an attack, it can be difficult to breathe. Asthma attacks can range from minor to life threatening. Asthma cannot be cured, but medicines and lifestyle changes can help control it and treat acute attacks. What are the causes? This condition is believed to be caused by inherited (genetic) and environmental factors, but its exact cause is not known. There are many things that can bring on an asthma attack or make asthma symptoms worse (triggers). Asthma triggers are different for each person. Common triggers include:  Mold.  Dust.  Cigarette smoke.  Cockroaches.  Things that can cause allergy symptoms (allergens), such as animal dander or pollen from trees or grass.  Air pollutants such as household cleaners, wood smoke, smog, or chemical odors.  Cold air, weather changes, and winds (which increase molds and pollen in the air).  Strong emotional expressions such as crying or laughing hard.  Stress.  Certain medicines (such as aspirin) or types of medicines (such as beta-blockers).  Sulfites in foods and drinks. Foods and drinks that may contain sulfites include dried fruit, potato chips, and sparkling grape juice.  Infections or inflammatory conditions such as the flu, a cold, or inflammation of the nasal membranes (rhinitis).  Gastroesophageal reflux disease (GERD).  Exercise or strenuous activity. What are the signs or symptoms? Symptoms of this condition may occur right after asthma is triggered or many hours later. Symptoms include:  Wheezing. This can  sound like whistling when you breathe.  Excessive nighttime or early morning coughing.  Frequent or severe coughing with a common cold.  Chest tightness.  Shortness of breath.  Tiredness (fatigue) with minimal activity. How is this diagnosed? This condition is diagnosed based on:  Your medical history.  A physical exam.  Tests, which may include: ? Lung function studies and pulmonary studies (spirometry). These tests can evaluate the flow of air in your lungs. ? Allergy tests. ? Imaging tests, such as X-rays. How is this treated? There is no cure for this condition, but treatment can help control your symptoms. Treatment for asthma usually involves:  Identifying and avoiding your asthma triggers.  Using medicines to control your symptoms. Generally, two types of medicines are used to treat asthma: ? Controller medicines. These help prevent asthma symptoms from occurring. They are usually taken every day. ? Fast-acting reliever or rescue medicines. These quickly relieve asthma symptoms by widening the narrow and tight airways. They are used as needed and provide short-term relief.  Using supplemental oxygen. This may be needed during a severe episode.  Using other medicines, such as: ? Allergy medicines, such as antihistamines, if your asthma attacks are triggered by allergens. ? Immune medicines (immunomodulators). These are medicines that help control the immune system.  Creating an asthma action plan. An asthma action plan is a written plan for managing and treating your asthma attacks. This plan includes: ? A list of your asthma triggers and how to avoid them. ? Information about when medicines should be taken and when their dosage should be changed. ? Instructions about using a device called a peak flow meter. A peak flow meter measures how well the lungs are working   and the severity of your asthma. It helps you monitor your condition. Follow these instructions at  home: Controlling your home environment Control your home environment in the following ways to help avoid triggers and prevent asthma attacks:  Change your heating and air conditioning filter regularly.  Limit your use of fireplaces and wood stoves.  Get rid of pests (such as roaches and mice) and their droppings.  Throw away plants if you see mold on them.  Clean floors and dust surfaces regularly. Use unscented cleaning products.  Try to have someone else vacuum for you regularly. Stay out of rooms while they are being vacuumed and for a short while afterward. If you vacuum, use a dust mask from a hardware store, a double-layered or microfilter vacuum cleaner bag, or a vacuum cleaner with a HEPA filter.  Replace carpet with wood, tile, or vinyl flooring. Carpet can trap dander and dust.  Use allergy-proof pillows, mattress covers, and box spring covers.  Keep your bedroom a trigger-free room.  Avoid pets and keep windows closed when allergens are in the air.  Wash beddings every week in hot water and dry them in a dryer.  Use blankets that are made of polyester or cotton.  Clean bathrooms and kitchens with bleach. If possible, have someone repaint the walls in these rooms with mold-resistant paint. Stay out of the rooms that are being cleaned and painted.  Wash your hands often with soap and water. If soap and water are not available, use hand sanitizer.  Do not allow anyone to smoke in your home. General instructions  Take over-the-counter and prescription medicines only as told by your health care provider. ? Speak with your health care provider if you have questions about how or when to take the medicines. ? Make note if you are requiring more frequent dosages.  Do not use any products that contain nicotine or tobacco, such as cigarettes and e-cigarettes. If you need help quitting, ask your health care provider. Also, avoid being exposed to secondhand smoke.  Use a peak  flow meter as told by your health care provider. Record and keep track of the readings.  Understand and use the asthma action plan to help minimize, or stop an asthma attack, without needing to seek medical care.  Make sure you stay up to date on your yearly vaccinations as told by your health care provider. This may include vaccines for the flu and pneumonia.  Avoid outdoor activities when allergen counts are high and when air quality is low.  Wear a ski mask that covers your nose and mouth during outdoor winter activities. Exercise indoors on cold days if you can.  Warm up before exercising, and take time for a cool-down period after exercise.  Keep all follow-up visits as told by your health care provider. This is important. Where to find more information  For information about asthma, turn to the Centers for Disease Control and Prevention at www.cdc.gov/asthma/faqs.htm  For air quality information, turn to AirNow at https://airnow.gov/ Contact a health care provider if:  You have wheezing, shortness of breath, or a cough even while you are taking medicine to prevent attacks.  The mucus you cough up (sputum) is thicker than usual.  Your sputum changes from clear or white to yellow, green, gray, or bloody.  Your medicines are causing side effects, such as a rash, itching, swelling, or trouble breathing.  You need to use a reliever medicine more than 2-3 times a week.  Your peak   flow reading is still at 50-79% of your personal best after following your action plan for 1 hour.  You have a fever. Get help right away if:  You are getting worse and do not respond to treatment during an asthma attack.  You are short of breath when at rest or when doing very little physical activity.  You have difficulty eating, drinking, or talking.  You have chest pain or tightness.  You develop a fast heartbeat or palpitations.  You have a bluish color to your lips or fingernails.  You  are light-headed or dizzy, or you faint.  Your peak flow reading is less than 50% of your personal best.  You feel too tired to breathe normally. Summary  Asthma is a long-term (chronic) condition that causes recurrent episodes in which the airways become tight and narrow. These episodes can cause coughing, wheezing, shortness of breath, and chest pain.  Asthma cannot be cured, but medicines and lifestyle changes can help control it and treat acute attacks.  Make sure you understand how to avoid triggers and how and when to use your medicines.  Asthma attacks can range from minor to life threatening. Get help right away if you have an asthma attack and do not respond to treatment with your usual rescue medicines. This information is not intended to replace advice given to you by your health care provider. Make sure you discuss any questions you have with your health care provider. Document Revised: 06/10/2018 Document Reviewed: 05/12/2016 Elsevier Patient Education  2020 [employer]. Fluticasone; Salmeterol inhalation powder What is this medicine? FLUTICASONE; SALMETEROL (floo TIK a sone; sal ME te role) inhalation is a combination of two medicines that decrease inflammation and help to open up the airways of your lungs. It is used to treat COPD. This medicine is also used to treat asthma. Do NOT use for an acute asthma attack. Do NOT use for a COPD attack. This medicine may be used for other purposes; ask your health care provider or pharmacist if you have questions. COMMON BRAND NAME(S): Advair, AirDuo Digihaler, Airduo RespiClick What should I tell my health care provider before I take this medicine? They need to know if you have any of these conditions:  bone problems  diabetes  eye disease, vision problems  immune system problems  heart disease or irregular heartbeat  high blood pressure  infection  pheochromocytoma  seizures  thyroid disease  worsening  asthma  an unusual or allergic reaction to fluticasone; salmeterol, other corticosteroids, other medicines, foods, dyes, or preservatives  pregnant or trying to get pregnant  breast-feeding How should I use this medicine? This medicine is inhaled through the mouth. Rinse your mouth with water after use. Make sure not to swallow the water. Follow the directions on the prescription label. Do not use a spacer device with this inhaler. Take your medicine at regular intervals. Do not take your medicine more often than directed. Do not stop taking except on your doctor's advice. Make sure that you are using your inhaler correctly. Ask you doctor or health care provider if you have any questions. A special MedGuide will be given to you by the pharmacist with each prescription and refill. Be sure to read this information carefully each time. Talk to your pediatrician regarding the use of this medicine in children. Special care may be needed. Overdosage: If you think you have taken too much of this medicine contact a poison control center or emergency room at once. NOTE: This medicine  is only for you. Do not share this medicine with others. What if I miss a dose? If you miss a dose, use it as soon as you remember. If it is almost time for your next dose, use only that dose and continue with your regular schedule, spacing doses evenly. Do not use double or extra doses. What may interact with this medicine? Do not take this medicine with any of the following medications:  MAOIs like Carbex, Eldepryl, Marplan, Nardil, and Parnate This medicine may also interact with the following medications:  aminophylline or theophylline  antiviral medicines for HIV or AIDS  beta-blockers like metoprolol and propranolol  certain antibiotics like clarithromycin, erythromycin, levofloxacin, linezolid, and telithromycin  certain medicines for fungal infections like ketoconazole, itraconazole, posaconazole,  voriconazole  conivaptan  diuretics  medicines for colds  medicines for depression or emotional conditions  nefazodone  vaccines This list may not describe all possible interactions. Give your health care provider a list of all the medicines, herbs, non-prescription drugs, or dietary supplements you use. Also tell them if you smoke, drink alcohol, or use illegal drugs. Some items may interact with your medicine. What should I watch for while using this medicine? Visit your doctor for regular check ups. Tell your doctor or health care professional if your symptoms do not get better. Do not use this medicine more than every 12 hours. NEVER use this medicine for an acute asthma attack. You should use your short-acting rescue inhalers for this purpose. If your symptoms get worse or if you need your short-acting inhalers more often, call your doctor right away. If you are going to have surgery tell your doctor or health care professional that you are using this medicine. Try not to come in contact with people with the chicken pox or measles. If you do, call your doctor. This medicine may increase blood sugar. Ask your healthcare provider if changes in diet or medicines are needed if you have diabetes. What side effects may I notice from receiving this medicine? Side effects that you should report to your doctor or health care professional as soon as possible:  allergic reactions like skin rash or hives, swelling of the face, lips, or tongue  breathing problems right after inhaling your medicine  chest pain  fast, irregular heartbeat  feeling faint or lightheaded, falls  fever or chills  nausea, vomiting  signs and symptoms of high blood sugar such as being more thirsty or hungry or having to urinate more than normal. You may also feel very tired or have blurry vision. Side effects that usually do not require medical attention (report to your doctor or health care professional if they  continue or are bothersome):  cough  headache  nervousness  sore throat  stuffy nose  tremor This list may not describe all possible side effects. Call your doctor for medical advice about side effects. You may report side effects to FDA at 1-800-FDA-1088. Where should I keep my medicine? Keep out of the reach of children. Advair: Store at room temperature between 68 and 77 degrees F (20 and 25 degrees C). Do not leave your medicine in the heat or sun. Throw away 1 month after you open the package or whenever the dose indicator reads 0, whichever comes first. Throw away unopened packages after the expiration date. Airduo Respiclick: Store at room temperature between 59 and 86 degrees F (15 and 30 degrees C). Avoid exposure to extreme heat, cold, or humidity. Throw away 1 month after  you open the package or whenever the dose indicator reads 0, whichever comes first. Throw away unopened packages after the expiration date. NOTE: This sheet is a summary. It may not cover all possible information. If you have questions about this medicine, talk to your doctor, pharmacist, or health care provider.  2020 Elsevier/Gold Standard (2018-01-06 11:26:46)

## 2020-02-24 ENCOUNTER — Telehealth: Payer: Self-pay | Admitting: Family Medicine

## 2020-02-24 NOTE — Telephone Encounter (Signed)
Attempted to schedule AWV. Unable to LVM.  Will try at later time.  

## 2020-03-01 ENCOUNTER — Ambulatory Visit (INDEPENDENT_AMBULATORY_CARE_PROVIDER_SITE_OTHER): Payer: Medicare HMO | Admitting: Internal Medicine

## 2020-03-01 ENCOUNTER — Other Ambulatory Visit: Payer: Self-pay

## 2020-03-01 ENCOUNTER — Encounter: Payer: Self-pay | Admitting: Internal Medicine

## 2020-03-01 VITALS — BP 117/69 | HR 68 | Ht 71.0 in | Wt 237.2 lb

## 2020-03-01 DIAGNOSIS — M041 Periodic fever syndromes: Secondary | ICD-10-CM

## 2020-03-01 MED ORDER — PREDNISONE 10 MG PO TABS: ORAL_TABLET | ORAL | 0 refills | Status: AC

## 2020-03-01 NOTE — Patient Instructions (Signed)
I recommend trying oral prednisone for treatment with your more severe flare ups. This medication should usually improve symptoms within 2-3 days but is recommended to taper off to avoid symptoms coming right back. This is associated with potential side effects including high blood sugar, high blood pressure, weight gain, insomnia, mood changes, and stomach irritation. Long term use is associated with cataracts and low bone density so would not take outside of these episodes. We will follow up to see if they helped control your fever rash and joint pain adequately or if you had side effects from the medicine.   Prednisone tablets What is this medicine? PREDNISONE (PRED ni sone) is a corticosteroid. It is commonly used to treat inflammation of the skin, joints, lungs, and other organs. Common conditions treated include asthma, allergies, and arthritis. It is also used for other conditions, such as blood disorders and diseases of the adrenal glands. This medicine may be used for other purposes; ask your health care provider or pharmacist if you have questions. COMMON BRAND NAME(S): Deltasone, Predone, Sterapred, Sterapred DS What should I tell my health care provider before I take this medicine? They need to know if you have any of these conditions:  Cushing's syndrome  diabetes  glaucoma  heart disease  high blood pressure  infection (especially a virus infection such as chickenpox, cold sores, or herpes)  kidney disease  liver disease  mental illness  myasthenia gravis  osteoporosis  seizures  stomach or intestine problems  thyroid disease  an unusual or allergic reaction to lactose, prednisone, other medicines, foods, dyes, or preservatives  pregnant or trying to get pregnant  breast-feeding How should I use this medicine? Take this medicine by mouth with a glass of water. Follow the directions on the prescription label. Take this medicine with food. If you are taking  this medicine once a day, take it in the morning. Do not take more medicine than you are told to take. Do not suddenly stop taking your medicine because you may develop a severe reaction. Your doctor will tell you how much medicine to take. If your doctor wants you to stop the medicine, the dose may be slowly lowered over time to avoid any side effects. Talk to your pediatrician regarding the use of this medicine in children. Special care may be needed. Overdosage: If you think you have taken too much of this medicine contact a poison control center or emergency room at once. NOTE: This medicine is only for you. Do not share this medicine with others. What if I miss a dose? If you miss a dose, take it as soon as you can. If it is almost time for your next dose, talk to your doctor or health care professional. You may need to miss a dose or take an extra dose. Do not take double or extra doses without advice. What may interact with this medicine? Do not take this medicine with any of the following medications:  metyrapone  mifepristone This medicine may also interact with the following medications:  aminoglutethimide  amphotericin B  aspirin and aspirin-like medicines  barbiturates  certain medicines for diabetes, like glipizide or glyburide  cholestyramine  cholinesterase inhibitors  cyclosporine  digoxin  diuretics  ephedrine  male hormones, like estrogens and birth control pills  isoniazid  ketoconazole  NSAIDS, medicines for pain and inflammation, like ibuprofen or naproxen  phenytoin  rifampin  toxoids  vaccines  warfarin This list may not describe all possible interactions. Give your health  care provider a list of all the medicines, herbs, non-prescription drugs, or dietary supplements you use. Also tell them if you smoke, drink alcohol, or use illegal drugs. Some items may interact with your medicine. What should I watch for while using this  medicine? Visit your doctor or health care professional for regular checks on your progress. If you are taking this medicine over a prolonged period, carry an identification card with your name and address, the type and dose of your medicine, and your doctor's name and address. This medicine may increase your risk of getting an infection. Tell your doctor or health care professional if you are around anyone with measles or chickenpox, or if you develop sores or blisters that do not heal properly. If you are going to have surgery, tell your doctor or health care professional that you have taken this medicine within the last twelve months. Ask your doctor or health care professional about your diet. You may need to lower the amount of salt you eat. This medicine may increase blood sugar. Ask your healthcare provider if changes in diet or medicines are needed if you have diabetes. What side effects may I notice from receiving this medicine? Side effects that you should report to your doctor or health care professional as soon as possible:  allergic reactions like skin rash, itching or hives, swelling of the face, lips, or tongue  changes in emotions or moods  changes in vision  depressed mood  eye pain  fever or chills, cough, sore throat, pain or difficulty passing urine  signs and symptoms of high blood sugar such as being more thirsty or hungry or having to urinate more than normal. You may also feel very tired or have blurry vision.  swelling of ankles, feet Side effects that usually do not require medical attention (report to your doctor or health care professional if they continue or are bothersome):  confusion, excitement, restlessness  headache  nausea, vomiting  skin problems, acne, thin and shiny skin  trouble sleeping  weight gain This list may not describe all possible side effects. Call your doctor for medical advice about side effects. You may report side effects to  FDA at 1-800-FDA-1088. Where should I keep my medicine? Keep out of the reach of children. Store at room temperature between 15 and 30 degrees C (59 and 86 degrees F). Protect from light. Keep container tightly closed. Throw away any unused medicine after the expiration date. NOTE: This sheet is a summary. It may not cover all possible information. If you have questions about this medicine, talk to your doctor, pharmacist, or health care provider.  2020 Elsevier/Gold Standard (2018-01-05 10:54:22)

## 2020-03-01 NOTE — Progress Notes (Signed)
Office Visit Note  Patient: Rickey Becker             Date of Birth: [birth year]           MRN: [id number]             PCP: Libby Maw, MD Referring: Libby Maw,* Visit Date: 03/01/2020  Subjective:  New Patient (Initial Visit)   History of Present Illness: DUQUAN GILLOOLY is a 29 y.o. male here for evaluation and management of HIDS.  Episodes of his disease date back to about 13 weeks of age have been present intermittently since then.  He states in the past he recalls having flares about every 2 months recently he estimates 9 or 10 times per year.  He can usually feel when these are coming on but will develop fevers by about day 2 of symptoms.  These are accompanied by skin rashes some of which have remained hyperpigmented after resolution.  He notices cervical lymphadenopathy.  He also has joint pain and joint swelling that will come on acutely overnight.  After these major flareups symptoms will subside after about 2 or sometimes 3 weeks.  He thinks there are some additional minor more self-limited symptoms that will last for a day or 2 outside of these major flares.  He is feeling well in between episodes besides minor joint pain and some of the residual skin changes.  Labs reviewed 12/2019 IgD 908 CRP 1.9 ESR 55 UA 11-20/hpf WBCs HCV negative CBC normal CMP normal  Imaging reviewed Xray chest 2017 No cardiopulmonary disease  CT abdomen pelvis Stable inguinal and retroperitoneal enlarged lymph nodes  Xray left ankle 2014 IMPRESSION:  Lateral ligamentous injury with a tiny avulsion fracture of the tip  of the fibula and lateral soft tissue swelling.   Activities of Daily Living:  Patient reports morning stiffness for 1-5 minutes.   Patient Denies nocturnal pain.  Difficulty dressing/grooming: Reports Difficulty climbing stairs: Reports Difficulty getting out of chair: Reports Difficulty using hands for taps, buttons, cutlery, and/or writing:  Reports  Review of Systems  Constitutional: Negative for fatigue.  HENT: Negative for mouth sores, mouth dryness and nose dryness.   Eyes: Negative for pain, itching, visual disturbance and dryness.  Respiratory: Negative for cough, hemoptysis, shortness of breath and difficulty breathing.   Cardiovascular: Negative for chest pain, palpitations and swelling in legs/feet.  Gastrointestinal: Negative for abdominal pain, blood in stool, constipation and diarrhea.  Endocrine: Negative for increased urination.  Genitourinary: Negative for painful urination.  Musculoskeletal: Positive for arthralgias, joint pain, joint swelling, myalgias, muscle weakness, morning stiffness, muscle tenderness and myalgias.  Skin: Positive for rash and redness. Negative for color change.  Allergic/Immunologic: Positive for susceptible to infections.  Neurological: Negative for dizziness, numbness, headaches, memory loss and weakness.  Hematological: Negative for swollen glands.  Psychiatric/Behavioral: Negative for confusion and sleep disturbance.    PMFS History:  Patient Active Problem List   Diagnosis Date Noted  . Moderate persistent asthma without complication 40/34/7425  . Pyuria 01/05/2020  . Healthcare maintenance 12/30/2019  . HIDS (hyperimmunoglobulinemia D with recurrent fever syndrome) ([place]) 05/15/2019    Past Medical History:  Diagnosis Date  . Asthma   . HIDS (hyperimmunoglobulinemia D with recurrent fever syndrome) ([place])    Per patient  . Juvenile arthritis ([place])   . Periodic fever syndrome ([facility])     Family History  Problem Relation Age of Onset  . Hypertension Mother   . Cancer Father   .  HIV Father    Past Surgical History:  Procedure Laterality Date  . EXTERNAL EAR SURGERY    . INNER EAR SURGERY     Social History   Social History Narrative  . Not on file   Immunization History  Administered Date(s) Administered  . PFIZER SARS-COV-2 Vaccination 09/16/2019, 10/11/2019       Objective: Vital Signs: BP 117/69 (BP Location: Left Arm, Patient Position: Sitting, Cuff Size: Small)   Pulse 68   Ht '5\' 11"'  (1.803 m)   Wt 237 lb 3.2 oz (107.6 kg)   BMI 33.08 kg/m    Physical Exam HENT:     Right Ear: External ear normal.     Left Ear: External ear normal.     Mouth/Throat:     Mouth: Mucous membranes are moist.     Pharynx: Oropharynx is clear.     Comments: Braces with bands in place Eyes:     Conjunctiva/sclera: Conjunctivae normal.  Cardiovascular:     Rate and Rhythm: Normal rate and regular rhythm.  Pulmonary:     Effort: Pulmonary effort is normal.     Breath sounds: Normal breath sounds.  Skin:    General: Skin is warm and dry.     Comments: Scattered round hyperpigmented skin patches scattered on trunk and all extremities especially noticeable on anterior legs  Neurological:     General: No focal deficit present.     Mental Status: He is alert.  Psychiatric:        Mood and Affect: Mood normal.     Musculoskeletal Exam:  Neck full range of motion no tenderness Shoulder, elbow, wrist, fingers full range of motion no tenderness or swelling Normal hip internal and external rotation without pain, no tenderness to lateral hip palpation Knees, ankles, MTPs full range of motion no tenderness or swelling  CDAI Exam: CDAI Score: -- Patient Global: --; Provider Global: -- Swollen: --; Tender: -- Joint Exam 03/01/2020   No joint exam has been documented for this visit   There is currently no information documented on the homunculus. Go to the Rheumatology activity and complete the homunculus joint exam.  Investigation: No additional findings.  Imaging: No results found.  Recent Labs: Lab Results  Component Value Date   WBC 5.7 01/03/2020   HGB 14.1 01/03/2020   PLT 205.0 01/03/2020   NA 139 01/03/2020   K 3.9 01/03/2020   CL 102 01/03/2020   CO2 29 01/03/2020   GLUCOSE 92 01/03/2020   BUN 10 01/03/2020   CREATININE 0.89  01/03/2020   BILITOT 0.8 01/03/2020   ALKPHOS 76 01/03/2020   AST 34 01/03/2020   ALT 34 01/03/2020   PROT 7.7 01/03/2020   ALBUMIN 4.0 01/03/2020   CALCIUM 8.8 01/03/2020   GFRAA >60 03/09/2017    Speciality Comments: No specialty comments available.  Procedures:  No procedures performed Allergies: Codeine   Assessment / Plan:     Visit Diagnoses: HIDS (hyperimmunoglobulinemia D with recurrent fever syndrome) ([place]) - Plan: Protein / creatinine ratio, urine, Protein Electrophoresis, (serum), Urinalysis, Sedimentation rate, predniSONE (DELTASONE) 10 MG tablet  We discussed his diagnosis with prognosis and associated conditions also management options today.  He has no specific manifestations at this time but will check SPEP and urine protein screening for monoclonal gammopathy or amyloid.  We will also repeat sedimentation rate to see if this has returned to a normal level in between acute episodes.  Discussed treatment options including NSAIDs, glucocorticoids, IL-1 inhibitors.  He  states he is used NSAIDs in the past and these do no noticeable benefit in his symptoms.  He states he remembers discussing injectable medicine as a pediatric patient at [facility] never started this there was discussion of side effects or excessive cost.  He does not recall whether he responded to glucocorticoids for symptom management in the past or not.  I recommend we can try 1 week tapering course of high-dose prednisone when he has his more severe flare episodes with the fevers rashes joint swelling.  If this fails to improve symptoms or he has side effects from the medicine we could consider treatment such as anakinra.  Orders: Orders Placed This Encounter  Procedures  . Protein / creatinine ratio, urine  . Protein Electrophoresis, (serum)  . Urinalysis  . Sedimentation rate   Meds ordered this encounter  Medications  . predniSONE (DELTASONE) 10 MG tablet    Sig: Take 6 tablets (60 mg total) by mouth  daily with breakfast for 2 days, THEN 5 tablets (50 mg total) daily with breakfast for 1 day, THEN 4 tablets (40 mg total) daily with breakfast for 1 day, THEN 3 tablets (30 mg total) daily with breakfast for 1 day, THEN 2 tablets (20 mg total) daily with breakfast for 1 day, THEN 1 tablet (10 mg total) daily with breakfast for 1 day.    Dispense:  27 tablet    Refill:  0     Follow-Up Instructions: Return in about 2 months (around 05/01/2020) for HIDS f/u.   Collier Salina, MD  Note - This record has been created using [employer].  Chart creation errors have been sought, but may not always  have been located. Such creation errors do not reflect on  the standard of medical care.

## 2020-03-05 LAB — PROTEIN / CREATININE RATIO, URINE
Creatinine, Urine: 110 mg/dL (ref 20–320)
Protein/Creat Ratio: 45 mg/g creat (ref 22–128)
Protein/Creatinine Ratio: 0.045 mg/mg creat (ref 0.022–0.12)
Total Protein, Urine: 5 mg/dL (ref 5–25)

## 2020-03-05 LAB — PROTEIN ELECTROPHORESIS, SERUM
Albumin ELP: 3.9 g/dL (ref 3.8–4.8)
Alpha 1: 0.3 g/dL (ref 0.2–0.3)
Alpha 2: 0.7 g/dL (ref 0.5–0.9)
Beta 2: 0.8 g/dL — ABNORMAL HIGH (ref 0.2–0.5)
Beta Globulin: 0.5 g/dL (ref 0.4–0.6)
Gamma Globulin: 2.1 g/dL — ABNORMAL HIGH (ref 0.8–1.7)
Total Protein: 8.3 g/dL — ABNORMAL HIGH (ref 6.1–8.1)

## 2020-03-05 LAB — SEDIMENTATION RATE: Sed Rate: 46 mm/h — ABNORMAL HIGH (ref 0–15)

## 2020-03-05 LAB — URINALYSIS
Bilirubin Urine: NEGATIVE
Glucose, UA: NEGATIVE
Hgb urine dipstick: NEGATIVE
Ketones, ur: NEGATIVE
Nitrite: NEGATIVE
Protein, ur: NEGATIVE
Specific Gravity, Urine: 1.011 (ref 1.001–1.03)
pH: 6 (ref 5.0–8.0)

## 2020-03-05 NOTE — Progress Notes (Signed)
Labs showed mild increased markers of inflammation even though he was between flares. Serum immunoglobulins were elevated which is unsurprising but no evidence toward malignancy. Urine and kidney function looks fine. We can follow up as planned.

## 2020-03-19 ENCOUNTER — Other Ambulatory Visit: Payer: Self-pay | Admitting: Family Medicine

## 2020-03-19 DIAGNOSIS — J4540 Moderate persistent asthma, uncomplicated: Secondary | ICD-10-CM

## 2020-04-11 ENCOUNTER — Telehealth: Payer: Self-pay | Admitting: Family Medicine

## 2020-04-11 NOTE — Telephone Encounter (Signed)
Patient is calling to see if he can get a prescription for Naproxen. He states that this is a new medication, but he and Dr. Doreene Burke had discussed this previously. Patient is aware that Dr. Doreene Burke is not in the office and I advised him that other provider may not prescribe it since it is a new medication. Please call him at [phone] if you have any questions.  Pharmacy: [facility] on [address]

## 2020-04-11 NOTE — Telephone Encounter (Signed)
Virtual appt made  

## 2020-04-11 NOTE — Telephone Encounter (Signed)
Spoke with patient and he states he has inflamed joints and would like the naproxen to help with this.

## 2020-04-12 ENCOUNTER — Telehealth: Payer: Medicare HMO | Admitting: Family

## 2020-04-12 NOTE — Telephone Encounter (Signed)
Patient no showed for appointment

## 2020-04-28 ENCOUNTER — Telehealth: Payer: Self-pay | Admitting: Family Medicine

## 2020-04-28 ENCOUNTER — Encounter: Payer: Self-pay | Admitting: Family Medicine

## 2020-04-28 NOTE — Telephone Encounter (Signed)
Pt was no show for appt 04/12/2020. 2nd occurrence. 01/16/2020 and 04/12/2020 Letter mailed.

## 2020-05-01 NOTE — Progress Notes (Signed)
Office Visit Note  Patient: Rickey Becker             Date of Birth: [birth year]           MRN: [id number]             PCP: Mliss Sax, MD Referring: Mliss Sax,* Visit Date: 05/02/2020   Subjective:  Follow-up (Patient complains of being in a cycle of symptoms currently. Patient complains specifically of dizziness, drowsiness, joint and muscle pain, thrush in the mouth, diarrhea, and skin sores on left thigh. )   History of Present Illness: Rickey Becker is a 30 y.o. male here for follow up of HIDS. He has been in usual health until recently he noticed a large skin boil on the left thigh which is typically an early symptom for a flare. He started taking the prednisone after this with symptoms since at least 2 days. This was preceded by a high level of stress from work and life but no specific major medical events. He now feels extremely fatigued, dizzy,with swollen lymph nodes, oral thrush, loose diarrhea, and skin lesions on his proximal legs. So far has not noticed benefit to medications but reports his symptoms usually worsen for a few days before improving.   Review of Systems  Constitutional: Positive for fatigue.  HENT: Positive for mouth sores, mouth dryness and nose dryness.   Eyes: Positive for dryness. Negative for pain, itching and visual disturbance.  Respiratory: Negative for cough, hemoptysis, shortness of breath and difficulty breathing.   Cardiovascular: Negative for chest pain, palpitations and swelling in legs/feet.  Gastrointestinal: Positive for diarrhea. Negative for abdominal pain, blood in stool and constipation.  Endocrine: Negative for increased urination.  Genitourinary: Negative for painful urination.  Musculoskeletal: Positive for arthralgias, joint pain, joint swelling, myalgias, muscle weakness, morning stiffness, muscle tenderness and myalgias.  Skin: Positive for nodules/bumps. Negative for color change, rash and redness.   Allergic/Immunologic: Positive for susceptible to infections.  Neurological: Positive for dizziness, numbness, headaches and weakness. Negative for memory loss.  Hematological: Positive for swollen glands.  Psychiatric/Behavioral: Negative for confusion and sleep disturbance.    PMFS History:  Patient Active Problem List   Diagnosis Date Noted  . Moderate persistent asthma without complication 01/30/2020  . Pyuria 01/05/2020  . Healthcare maintenance 12/30/2019  . HIDS (hyperimmunoglobulinemia D with recurrent fever syndrome) ([facility]) 05/15/2019    Past Medical History:  Diagnosis Date  . Asthma   . HIDS (hyperimmunoglobulinemia D with recurrent fever syndrome) ([facility])    Per patient  . Juvenile arthritis ([facility])   . Periodic fever syndrome ([facility])     Family History  Problem Relation Age of Onset  . Hypertension Mother   . Cancer Father   . HIV Father    Past Surgical History:  Procedure Laterality Date  . EXTERNAL EAR SURGERY    . INNER EAR SURGERY     Social History   Social History Narrative  . Not on file   Immunization History  Administered Date(s) Administered  . PFIZER SARS-COV-2 Vaccination 09/16/2019, 10/11/2019     Objective: Vital Signs: BP 130/85 (BP Location: Left Arm, Patient Position: Sitting, Cuff Size: Normal)   Pulse 90   Ht 6' (1.829 m)   Wt 235 lb 3.2 oz (106.7 kg)   BMI 31.90 kg/m    Physical Exam HENT:     Mouth/Throat:     Mouth: Mucous membranes are moist.     Comments: Thrush on surface  of tongue Eyes:     Conjunctiva/sclera: Conjunctivae normal.  Neck:     Comments: Palpable swelling, cervical lymphadenopathy <1cm diameter nodes mobile and nontender b/l Cardiovascular:     Rate and Rhythm: Normal rate and regular rhythm.  Pulmonary:     Effort: Pulmonary effort is normal.     Breath sounds: Normal breath sounds.  Skin:    General: Skin is warm and dry.     Comments: Left thigh circumscribed white colored boil, old hyperpigmented  flat lesions in surrounding area  Neurological:     Mental Status: He is alert.      Musculoskeletal Exam:  Wrist, fingers full range of motion no swelling   Investigation: No additional findings.  Imaging: No results found.  Recent Labs: Lab Results  Component Value Date   WBC 5.7 01/03/2020   HGB 14.1 01/03/2020   PLT 205.0 01/03/2020   NA 139 01/03/2020   K 3.9 01/03/2020   CL 102 01/03/2020   CO2 29 01/03/2020   GLUCOSE 92 01/03/2020   BUN 10 01/03/2020   CREATININE 0.89 01/03/2020   BILITOT 0.8 01/03/2020   ALKPHOS 76 01/03/2020   AST 34 01/03/2020   ALT 34 01/03/2020   PROT 8.3 (H) 03/01/2020   ALBUMIN 4.0 01/03/2020   CALCIUM 8.8 01/03/2020   GFRAA >60 03/09/2017    Speciality Comments: No specialty comments available.  Procedures:  No procedures performed Allergies: Codeine   Assessment / Plan:     Visit Diagnoses: HIDS (hyperimmunoglobulinemia D with recurrent fever syndrome) ([facility])  Symptoms are consistent with past flares current features are fatigue, dizziness, lymphadenopathy, skin lesions, diarrhea, oral thrush. He is 2 days into flare he started taking the prednisone taper starting 60mg  dose that will be 12 days duration. Recommend he stay on it and keep track of when symptoms start to improve and how long until resolution. Note provided anticipate cannot return to work, which requires repeated 50-60 lbs lifting, until recovery anticipate within 10 days. If he notices many fevers can consider colchicine prophylaxis. If no improvement compared to past flares with the steroids would consider IL-1 inhibition going forwards.  Orders: No orders of the defined types were placed in this encounter.  No orders of the defined types were placed in this encounter.   Follow-Up Instructions: Return in about 3 weeks (around 05/23/2020) for HIDS f/u.   07/21/2020, MD  Note - This record has been created using [employer].  Chart creation errors  have been sought, but may not always  have been located. Such creation errors do not reflect on  the standard of medical care.

## 2020-05-02 ENCOUNTER — Ambulatory Visit (INDEPENDENT_AMBULATORY_CARE_PROVIDER_SITE_OTHER): Payer: Medicare HMO | Admitting: Internal Medicine

## 2020-05-02 ENCOUNTER — Encounter: Payer: Self-pay | Admitting: Internal Medicine

## 2020-05-02 ENCOUNTER — Other Ambulatory Visit: Payer: Self-pay

## 2020-05-02 VITALS — BP 130/85 | HR 90 | Ht 72.0 in | Wt 235.2 lb

## 2020-05-02 DIAGNOSIS — M041 Periodic fever syndromes: Secondary | ICD-10-CM

## 2020-05-03 ENCOUNTER — Other Ambulatory Visit: Payer: Medicare HMO

## 2020-05-03 DIAGNOSIS — Z20822 Contact with and (suspected) exposure to covid-19: Secondary | ICD-10-CM | POA: Diagnosis not present

## 2020-05-05 LAB — NOVEL CORONAVIRUS, NAA: SARS-CoV-2, NAA: DETECTED — AB

## 2020-05-05 LAB — SARS-COV-2, NAA 2 DAY TAT

## 2020-05-06 ENCOUNTER — Telehealth: Payer: Self-pay | Admitting: Adult Health

## 2020-05-06 NOTE — Telephone Encounter (Signed)
Called to discuss with patient about COVID-19 symptoms and the use of one of the available treatments for those with mild to moderate Covid symptoms and at a high risk of hospitalization.  Pt appears to qualify for outpatient treatment due to co-morbid conditions and/or a member of an at-risk group in accordance with the FDA Emergency Use Authorization.    Symptom onset: ? Vaccinated: yes  Booster? ?  Immunocompromised? No  Qualifiers: BMI   Unable to reach pt - Left message to call back .   Pawan Knechtel NP-C

## 2020-05-21 NOTE — Progress Notes (Deleted)
Office Visit Note  Patient: Rickey Becker             Date of Birth: [birth year]           MRN: [id number]             PCP: Mliss Sax, MD Referring: Mliss Sax,* Visit Date: 05/22/2020 Occupation: @GUAROCC @  Subjective:  No chief complaint on file.   History of Present Illness: Rickey Becker is a 30 y.o. male ***    History of Present Illness: Rickey Becker is a 30 y.o. male here for follow up of HIDS. He has been in usual health until recently he noticed a large skin boil on the left thigh which is typically an early symptom for a flare. He started taking the prednisone after this with symptoms since at least 2 days. This was preceded by a high level of stress from work and life but no specific major medical events. He now feels extremely fatigued, dizzy,with swollen lymph nodes, oral thrush, loose diarrhea, and skin lesions on his proximal legs. So far has not noticed benefit to medications but reports his symptoms usually worsen for a few days before improving.  Symptoms are consistent with past flares current features are fatigue, dizziness, lymphadenopathy, skin lesions, diarrhea, oral thrush. He is 2 days into flare he started taking the prednisone taper starting 60mg  dose that will be 12 days duration. Recommend he stay on it and keep track of when symptoms start to improve and how long until resolution. Note provided anticipate cannot return to work, which requires repeated 50-60 lbs lifting, until recovery anticipate within 10 days. If he notices many fevers can consider colchicine prophylaxis. If no improvement compared to past flares with the steroids would consider IL-1 inhibition going forwards. ***  No Rheumatology ROS completed.   PMFS History:  Patient Active Problem List   Diagnosis Date Noted  . Moderate persistent asthma without complication 01/30/2020  . Pyuria 01/05/2020  . Healthcare maintenance 12/30/2019  . HIDS  (hyperimmunoglobulinemia D with recurrent fever syndrome) (HCC) 05/15/2019    Past Medical History:  Diagnosis Date  . Asthma   . HIDS (hyperimmunoglobulinemia D with recurrent fever syndrome) (HCC)    Per patient  . Juvenile arthritis (HCC)   . Periodic fever syndrome (HCC)     Family History  Problem Relation Age of Onset  . Hypertension Mother   . Cancer Father   . HIV Father    Past Surgical History:  Procedure Laterality Date  . EXTERNAL EAR SURGERY    . INNER EAR SURGERY     Social History   Social History Narrative  . Not on file   Immunization History  Administered Date(s) Administered  . PFIZER(Purple Top)SARS-COV-2 Vaccination 09/16/2019, 10/11/2019     Objective: Vital Signs: There were no vitals taken for this visit.   Physical Exam   Musculoskeletal Exam: ***  CDAI Exam: CDAI Score: -- Patient Global: --; Provider Global: -- Swollen: --; Tender: -- Joint Exam 05/22/2020   No joint exam has been documented for this visit   There is currently no information documented on the homunculus. Go to the Rheumatology activity and complete the homunculus joint exam.  Investigation: No additional findings.  Imaging: No results found.  Recent Labs: Lab Results  Component Value Date   WBC 5.7 01/03/2020   HGB 14.1 01/03/2020   PLT 205.0 01/03/2020   NA 139 01/03/2020   K 3.9 01/03/2020   CL  102 01/03/2020   CO2 29 01/03/2020   GLUCOSE 92 01/03/2020   BUN 10 01/03/2020   CREATININE 0.89 01/03/2020   BILITOT 0.8 01/03/2020   ALKPHOS 76 01/03/2020   AST 34 01/03/2020   ALT 34 01/03/2020   PROT 8.3 (H) 03/01/2020   ALBUMIN 4.0 01/03/2020   CALCIUM 8.8 01/03/2020   GFRAA >60 03/09/2017    Speciality Comments: No specialty comments available.  Procedures:  No procedures performed Allergies: Codeine   Assessment / Plan:     Visit Diagnoses: No diagnosis found.  Orders: No orders of the defined types were placed in this encounter.  No  orders of the defined types were placed in this encounter.   Face-to-face time spent with patient was *** minutes. Greater than 50% of time was spent in counseling and coordination of care.  Follow-Up Instructions: No follow-ups on file.   Fuller Plan, MD  Note - This record has been created using [employer].  Chart creation errors have been sought, but may not always  have been located. Such creation errors do not reflect on  the standard of medical care.

## 2020-05-22 ENCOUNTER — Ambulatory Visit: Payer: Medicare HMO | Admitting: Internal Medicine

## 2020-05-24 ENCOUNTER — Telehealth: Payer: Self-pay | Admitting: Internal Medicine

## 2020-05-24 NOTE — Telephone Encounter (Signed)
Patient missed appointment due to being out of work with flares. Patient states his current job seems to be keeping him in a flare. Patient requesting a note for job for his time out of work. Patient has been out of work yesterday, and today due to fever, and body aches. Please call to advise.

## 2020-05-24 NOTE — Telephone Encounter (Signed)
Out of work since yesterday, does he need a letter or document immediately or can we see until when his symptoms improve to recommend? I see we rescheduled to next week.

## 2020-05-24 NOTE — Telephone Encounter (Signed)
Spoke with patient- he works Wednesday-Saturday, patient has been out of work since yesterday and presumes he will be out through Saturday based on the onset of symptoms. Work note created for patient, excusing him from work 05/23/2020-05/26/2020. Work note at the front desk, patient plans to pick up the note tomorrow and is scheduled for a follow-up visit on 05/29/2020.

## 2020-05-28 NOTE — Progress Notes (Signed)
Office Visit Note  Patient: Rickey Becker             Date of Birth: 11/03/1990           MRN: [id number]             PCP: Libby Maw, MD Referring: Libby Maw,* Visit Date: 05/29/2020   Subjective:  Follow-up (Patient has a flare with multiple boils, then other symptoms leveled out. Patient complains of worsening flares while working at [employer] and wearing a mask. )   History of Present Illness: Rickey Becker is a 30 y.o. male  here for follow up of HIDS. At his last visit he was starting to experience symptoms flare and tried the recommended prednisone taper. He thinks this might have helped briefly but did not significantly shorten overall episode and he has had some continued skin lesions and symptoms since then already. 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 He has had to start using his albuterol inhaler more frequently as well.    Review of Systems  Constitutional: Positive for fatigue.  HENT: Positive for mouth sores. Negative for mouth dryness and nose dryness.   Eyes: Negative for pain, itching, visual disturbance and dryness.  Respiratory: Positive for shortness of breath and difficulty breathing. Negative for cough and hemoptysis.   Cardiovascular: Positive for chest pain. Negative for palpitations and swelling in legs/feet.  Gastrointestinal: Positive for diarrhea. Negative for abdominal pain, blood in stool and constipation.  Endocrine: Negative for increased urination.  Genitourinary: Negative for painful urination.  Musculoskeletal: Positive for arthralgias, joint pain, joint swelling, myalgias, muscle weakness, morning stiffness, muscle tenderness and myalgias.  Skin: Positive for color change, rash, nodules/bumps and redness.  Allergic/Immunologic: Positive for susceptible to infections.  Neurological:  Positive for dizziness, headaches, parasthesias and weakness. Negative for numbness and memory loss.  Hematological: Positive for swollen glands.  Psychiatric/Behavioral: Negative for confusion and sleep disturbance.    PMFS History:  Patient Active Problem List   Diagnosis Date Noted  . Moderate persistent asthma without complication 70/04/7492  . Pyuria 01/05/2020  . Healthcare maintenance 12/30/2019  . HIDS (hyperimmunoglobulinemia D with recurrent fever syndrome) ([place]) 05/15/2019    Past Medical History:  Diagnosis Date  . Asthma   . HIDS (hyperimmunoglobulinemia D with recurrent fever syndrome) ([place])    Per patient  . Juvenile arthritis ([place])   . Periodic fever syndrome (HCC)     Family History  Problem Relation Age of Onset  . Hypertension Mother   . Cancer Father   . HIV Father    Past Surgical History:  Procedure Laterality Date  . EXTERNAL EAR SURGERY    . INNER EAR SURGERY     Social History   Social History Narrative  . Not on file   Immunization History  Administered Date(s) Administered  . PFIZER(Purple Top)SARS-COV-2 Vaccination 09/16/2019, 10/11/2019     Objective: Vital Signs: BP 125/78 (BP Location: Right Arm, Patient Position: Sitting, Cuff Size: Normal)   Pulse 62   Ht 6' (1.829 m)   Wt 242 lb 9.6 oz (110 kg)   BMI 32.90 kg/m    Physical Exam Eyes:     Conjunctiva/sclera: Conjunctivae normal.  Skin:    General: Skin is warm and dry.     Comments: Few closed and healing lesions on lateral thighs of varying age  Neurological:  General: No focal deficit present.  Psychiatric:        Mood and Affect: Mood normal.     Musculoskeletal Exam:  Elbows full ROM no tenderness or swelling Wrists full ROM no tenderness or swelling Fingers full ROM no tenderness or swelling Knees full ROM no tenderness or swelling Ankles full ROM no tenderness or swelling   Investigation: No additional findings.  Imaging: No results found.  Recent  Labs: Lab Results  Component Value Date   WBC 5.7 01/03/2020   HGB 14.1 01/03/2020   PLT 205.0 01/03/2020   NA 139 01/03/2020   K 3.9 01/03/2020   CL 102 01/03/2020   CO2 29 01/03/2020   GLUCOSE 92 01/03/2020   BUN 10 01/03/2020   CREATININE 0.89 01/03/2020   BILITOT 0.8 01/03/2020   ALKPHOS 76 01/03/2020   AST 34 01/03/2020   ALT 34 01/03/2020   PROT 8.3 (H) 03/01/2020   ALBUMIN 4.0 01/03/2020   CALCIUM 8.8 01/03/2020   GFRAA >60 03/09/2017    Speciality Comments: No specialty comments available.  Procedures:  No procedures performed Allergies: Codeine   Assessment / Plan:     Visit Diagnoses: HIDS (hyperimmunoglobulinemia D with recurrent fever syndrome) ([place]) - Plan: Colchicine (MITIGARE) 0.6 MG CAPS  HIDS syndrome seems to still be flaring pretty often with recent new skin lesions and oral ulcers and missing work with diffuse pain and fatigue. Prednisone response was partial would not recommend repeated courses side effects likely to outweigh the small benefit. He did have persistent high ESR in between flares so will recommend trial of colchicine for treatment and ppx. Plan to f/u in 8 wks for tolerability, response, and would monitor labs if long term use.  Moderate persistent asthma without complication  Also having increased asthma symptoms lately, not a typical features of HIDS but might benefit with some work accomodations overall for these exacerbation. Note provided in clinic today.  Orders: No orders of the defined types were placed in this encounter.  Meds ordered this encounter  Medications  . Colchicine (MITIGARE) 0.6 MG CAPS    Sig: Take 0.6 mg by mouth daily.    Dispense:  30 capsule    Refill:  1     Follow-Up Instructions: Return in about 8 weeks (around 07/24/2020) for HIDS f/u after colchicine start.   Collier Salina, MD  Note - This record has been created using [employer].  Chart creation errors have been sought, but may not  always  have been located. Such creation errors do not reflect on  the standard of medical care.

## 2020-05-29 ENCOUNTER — Encounter: Payer: Self-pay | Admitting: Internal Medicine

## 2020-05-29 ENCOUNTER — Ambulatory Visit (INDEPENDENT_AMBULATORY_CARE_PROVIDER_SITE_OTHER): Payer: Medicare HMO | Admitting: Internal Medicine

## 2020-05-29 ENCOUNTER — Other Ambulatory Visit: Payer: Self-pay

## 2020-05-29 VITALS — BP 125/78 | HR 62 | Ht 72.0 in | Wt 242.6 lb

## 2020-05-29 DIAGNOSIS — J4540 Moderate persistent asthma, uncomplicated: Secondary | ICD-10-CM | POA: Diagnosis not present

## 2020-05-29 DIAGNOSIS — M041 Periodic fever syndromes: Secondary | ICD-10-CM | POA: Diagnosis not present

## 2020-05-29 MED ORDER — COLCHICINE 0.6 MG PO CAPS: 0.6000 mg | ORAL_CAPSULE | Freq: Every day | ORAL | 1 refills | Status: DC

## 2020-05-29 NOTE — Patient Instructions (Signed)
Colchicine tablets or capsules What is this medicine? COLCHICINE (KOL chi seen) is used to prevent or treat attacks of acute gout or gouty arthritis. This medicine is also used to treat familial Mediterranean fever. This medicine may be used for other purposes; ask your health care provider or pharmacist if you have questions. COMMON BRAND NAME(S): Colcrys, MITIGARE What should I tell my health care provider before I take this medicine? They need to know if you have any of these conditions:  kidney disease  liver disease  an unusual or allergic reaction to colchicine, other medicines, foods, dyes, or preservatives  pregnant or trying to get pregnant  breast-feeding How should I use this medicine? Take this medicine by mouth with a full glass of water. Follow the directions on the prescription label. You can take it with or without food. If it upsets your stomach, take it with food. Take your medicine at regular intervals. Do not take your medicine more often than directed. A special MedGuide will be given to you by the pharmacist with each prescription and refill. Be sure to read this information carefully each time. Talk to your pediatrician regarding the use of this medicine in children. While this drug may be prescribed for children as young as 28 years old for selected conditions, precautions do apply. Patients over 31 years old may have a stronger reaction and need a smaller dose. Overdosage: If you think you have taken too much of this medicine contact a poison control center or emergency room at once. NOTE: This medicine is only for you. Do not share this medicine with others. What if I miss a dose? If you miss a dose, take it as soon as you can. If it is almost time for your next dose, take only that dose. Do not take double or extra doses. What may interact with this medicine? Do not take this medicine with any of the following medications:  certain antivirals for HIV or  hepatitis This medicine may also interact with the following medications:  certain antibiotics like erythromycin or clarithromycin  certain medicines for blood pressure, heart disease, irregular heart beat  certain medicines for cholesterol like atorvastatin, lovastatin, and simvastatin  certain medicines for fungal infections like ketoconazole, itraconazole, or posaconazole  cyclosporine  grapefruit or grapefruit juice This list may not describe all possible interactions. Give your health care provider a list of all the medicines, herbs, non-prescription drugs, or dietary supplements you use. Also tell them if you smoke, drink alcohol, or use illegal drugs. Some items may interact with your medicine. What should I watch for while using this medicine? Visit your healthcare professional for regular checks on your progress. Tell your healthcare professional if your symptoms do not start to get better or if they get worse. You should make sure you get enough vitamin B12 while you are taking this medicine. Discuss the foods you eat and the vitamins you take with your healthcare professional. This medicine may increase your risk to bruise or bleed. Call you healthcare professional if you notice any unusual bleeding. What side effects may I notice from receiving this medicine? Side effects that you should report to your doctor or health care professional as soon as possible:  allergic reactions like skin rash, itching or hives, swelling of the face, lips, or tongue  low blood counts - this medicine may decrease the number of white blood cells, red blood cells, and platelets. You may be at increased risk for infections and bleeding  pain, tingling, numbness in the hands or feet  severe diarrhea  signs and symptoms of infection like fever; chills; cough; sore throat; pain or trouble passing urine  signs and symptoms of muscle injury like dark urine; trouble passing urine or change in the  amount of urine; unusually weak or tired; muscle pain; back pain  unusual bleeding or bruising  unusually weak or tired  vomiting Side effects that usually do not require medical attention (report these to your doctor or health care professional if they continue or are bothersome):  mild diarrhea  nausea  stomach pain This list may not describe all possible side effects. Call your doctor for medical advice about side effects. You may report side effects to FDA at 1-800-FDA-1088. Where should I keep my medicine? Keep out of the reach of children. Store at room temperature between 15 and 30 degrees C (59 and 86 degrees F). Keep container tightly closed. Protect from light. Throw away any unused medicine after the expiration date. NOTE: This sheet is a summary. It may not cover all possible information. If you have questions about this medicine, talk to your doctor, pharmacist, or health care provider.  2021 Elsevier/Gold Standard (2017-06-24 18:45:11)

## 2020-06-04 ENCOUNTER — Ambulatory Visit: Payer: Medicare HMO | Admitting: Family Medicine

## 2020-06-05 ENCOUNTER — Ambulatory Visit: Payer: Medicare HMO | Admitting: Family Medicine

## 2020-06-11 ENCOUNTER — Ambulatory Visit: Payer: Medicare HMO | Admitting: Family Medicine

## 2020-06-12 ENCOUNTER — Encounter: Payer: Self-pay | Admitting: Family Medicine

## 2020-06-12 ENCOUNTER — Telehealth: Payer: Self-pay | Admitting: Family Medicine

## 2020-06-12 NOTE — Telephone Encounter (Signed)
Pt was no show for appt 06/11/2020 acute/Dr. Veto Kemps. 3rd no show occurrence in additional to same day cancellation (01/16/2020, 04/12/2020, 06/04/2020, 06/11/2020) Letter mailed to dismiss.

## 2020-06-29 ENCOUNTER — Ambulatory Visit: Payer: Medicare HMO | Admitting: Family Medicine

## 2020-07-30 NOTE — Progress Notes (Deleted)
   Office Visit Note  Patient: Rickey Becker             Date of Birth: 03-Aug-1990           MRN: [id number]             PCP: Mliss Sax, MD Referring: Mliss Sax,* Visit Date: 07/31/2020   Subjective:  No chief complaint on file.   History of Present Illness: AVRAM DANIELSON is a 30 y.o. male here for follow up of HIDS after starting low dose colchicine ppx.***     No Rheumatology ROS completed.   PMFS History:  Patient Active Problem List   Diagnosis Date Noted  . Moderate persistent asthma without complication 01/30/2020  . Pyuria 01/05/2020  . Healthcare maintenance 12/30/2019  . HIDS (hyperimmunoglobulinemia D with recurrent fever syndrome) (HCC) 05/15/2019    Past Medical History:  Diagnosis Date  . Asthma   . HIDS (hyperimmunoglobulinemia D with recurrent fever syndrome) (HCC)    Per patient  . Juvenile arthritis (HCC)   . Periodic fever syndrome (HCC)     Family History  Problem Relation Age of Onset  . Hypertension Mother   . Cancer Father   . HIV Father    Past Surgical History:  Procedure Laterality Date  . EXTERNAL EAR SURGERY    . INNER EAR SURGERY     Social History   Social History Narrative  . Not on file   Immunization History  Administered Date(s) Administered  . PFIZER(Purple Top)SARS-COV-2 Vaccination 09/16/2019, 10/11/2019     Objective: Vital Signs: There were no vitals taken for this visit.   Physical Exam   Musculoskeletal Exam: ***  CDAI Exam: CDAI Score: -- Patient Global: --; Provider Global: -- Swollen: --; Tender: -- Joint Exam 07/31/2020   No joint exam has been documented for this visit   There is currently no information documented on the homunculus. Go to the Rheumatology activity and complete the homunculus joint exam.  Investigation: No additional findings.  Imaging: No results found.  Recent Labs: Lab Results  Component Value Date   WBC 5.7 01/03/2020   HGB 14.1 01/03/2020    PLT 205.0 01/03/2020   NA 139 01/03/2020   K 3.9 01/03/2020   CL 102 01/03/2020   CO2 29 01/03/2020   GLUCOSE 92 01/03/2020   BUN 10 01/03/2020   CREATININE 0.89 01/03/2020   BILITOT 0.8 01/03/2020   ALKPHOS 76 01/03/2020   AST 34 01/03/2020   ALT 34 01/03/2020   PROT 8.3 (H) 03/01/2020   ALBUMIN 4.0 01/03/2020   CALCIUM 8.8 01/03/2020   GFRAA >60 03/09/2017    Speciality Comments: No specialty comments available.  Procedures:  No procedures performed Allergies: Codeine   Assessment / Plan:     Visit Diagnoses: No diagnosis found.  ***  Orders: No orders of the defined types were placed in this encounter.  No orders of the defined types were placed in this encounter.    Follow-Up Instructions: No follow-ups on file.   Fuller Plan, MD  Note - This record has been created using [employer].  Chart creation errors have been sought, but may not always  have been located. Such creation errors do not reflect on  the standard of medical care.

## 2020-07-31 ENCOUNTER — Ambulatory Visit: Payer: Medicare HMO | Admitting: Internal Medicine

## 2020-08-07 ENCOUNTER — Ambulatory Visit (INDEPENDENT_AMBULATORY_CARE_PROVIDER_SITE_OTHER): Payer: Medicare HMO | Admitting: Internal Medicine

## 2020-08-07 ENCOUNTER — Other Ambulatory Visit: Payer: Self-pay

## 2020-08-07 ENCOUNTER — Encounter: Payer: Self-pay | Admitting: Internal Medicine

## 2020-08-07 VITALS — BP 126/73 | HR 94 | Ht 72.0 in | Wt 242.2 lb

## 2020-08-07 DIAGNOSIS — G8929 Other chronic pain: Secondary | ICD-10-CM | POA: Diagnosis not present

## 2020-08-07 DIAGNOSIS — M545 Low back pain, unspecified: Secondary | ICD-10-CM

## 2020-08-07 DIAGNOSIS — M041 Periodic fever syndromes: Secondary | ICD-10-CM | POA: Diagnosis not present

## 2020-08-07 MED ORDER — MELOXICAM 15 MG PO TABS: 15.0000 mg | ORAL_TABLET | Freq: Every day | ORAL | 2 refills | Status: AC

## 2020-08-07 NOTE — Patient Instructions (Signed)
Meloxicam capsules What is this medicine? MELOXICAM (mel OX i cam) is a non-steroidal anti-inflammatory drug, also known as an NSAID. It is used to treat pain, inflammation, and swelling. This medicine may be used for other purposes; ask your health care provider or pharmacist if you have questions. COMMON BRAND NAME(S): Vivlodex What should I tell my health care provider before I take this medicine? They need to know if you have any of these conditions:  asthma (lung or breathing disease)  bleeding disorder  coronary artery bypass graft (CABG) within the past 2 weeks  dehydration  heart attack  heart disease  heart failure  high blood pressure  if you often drink alcohol  kidney disease  liver disease  smoke tobacco cigarettes  stomach bleeding  stomach ulcers, other stomach or intestine problems  take medicines that treat or prevent blood clots  taking other steroids like dexamethasone or prednisone  an unusual or allergic reaction to meloxicam, other medicines, foods, dyes, or preservatives  pregnant or trying to get pregnant  breast-feeding How should I use this medicine? Take this medicine by mouth. Take it as directed on the prescription label at the same time every day. You can take it with or without food. If it upsets your stomach, take it with food. Do not use it more often than directed. There may be unused or extra doses in the bottle after you finish your treatment. Talk to your health care provider if you have questions about your dose. A special MedGuide will be given to you by the pharmacist with each prescription and refill. Be sure to read this information carefully each time. Talk to your health care provider about the use of this medicine in children. Special care may be needed. Patients over 31 years of age may have a stronger reaction and need a smaller dose. Overdosage: If you think you have taken too much of this medicine contact a poison  control center or emergency room at once. NOTE: This medicine is only for you. Do not share this medicine with others. What if I miss a dose? If you miss a dose, take it as soon as you can. If it is almost time for your next dose, take only that dose. Do not take double or extra doses. What may interact with this medicine? Do not take this medicine with any of the following medications:  cidofovir  ketorolac This medicine may also interact with the following medications:  aspirin and aspirin-like medicines  certain medicines for blood pressure, heart disease, irregular heart beat  certain medicines for depression, anxiety, or psychotic disturbances  certain medicines that treat or prevent blood clots like warfarin, enoxaparin, dalteparin, apixaban, dabigatran, rivaroxaban  cyclosporine  diuretics  fluconazole  lithium  methotrexate  other NSAIDs, medicines for pain and inflammation, like ibuprofen and naproxen  pemetrexed This list may not describe all possible interactions. Give your health care provider a list of all the medicines, herbs, non-prescription drugs, or dietary supplements you use. Also tell them if you smoke, drink alcohol, or use illegal drugs. Some items may interact with your medicine. What should I watch for while using this medicine? Visit your health care provider for regular checks on your progress. Tell your health care provider if your symptoms do not start to get better or if they get worse. Do not take other medicines that contain aspirin, ibuprofen, or naproxen with this medicine. Side effects such as stomach upset, nausea, or ulcers may be more likely to  occur. Many non-prescription medicines contain aspirin, ibuprofen, or naproxen. Always read labels carefully. This medicine can cause serious ulcers and bleeding in the stomach. It can happen with no warning. Smoking, drinking alcohol, older age, and poor health can also increase risks. Call your  health care provider right away if you have stomach pain or blood in your vomit or stool. This medicine does not prevent a heart attack or stroke. This medicine may increase the chance of a heart attack or stroke. The chance may increase the longer you use this medicine or if you have heart disease. If you take aspirin to prevent a heart attack or stroke, talk to your health care provider about using this medicine. Alcohol may interfere with the effect of this medicine. Avoid alcoholic drinks. This medicine may cause serious skin reactions. They can happen weeks to months after starting the medicine. Contact your health care provider right away if you notice fevers or flu-like symptoms with a rash. The rash may be red or purple and then turn into blisters or peeling of the skin. Or, you might notice a red rash with swelling of the face, lips or lymph nodes in your neck or under your arms. Talk to your health care provider if you are pregnant before taking this medicine. Taking this medicine between weeks 20 and 30 of pregnancy may harm your unborn baby. Your health care provider will monitor you closely if you need to take it. After 30 weeks of pregnancy, do not take this medicine. You may get drowsy or dizzy. Do not drive, use machinery, or do anything that needs mental alertness until you know how this medicine affects you. Do not stand up or sit up quickly, especially if you are an older patient. This reduces the risk of dizzy or fainting spells. Be careful brushing or flossing your teeth or using a toothpick because you may get an infection or bleed more easily. If you have any dental work done, tell your dentist you are receiving this medicine. This medicine may make it more difficult to get pregnant. Talk to your health care provider if you are concerned about your fertility. What side effects may I notice from receiving this medicine? Side effects that you should report to your doctor or health care  provider as soon as possible:  allergic reactions (skin rash, itching or hives; swelling of the face, lips, or tongue)  bleeding (bloody or black, tarry stools; red or dark brown urine; spitting up blood or brown material that looks like coffee grounds; red spots on the skin; unusual bruising or bleeding from the eyes, gums, or nose)  blood clot (chest pain; shortness of breath; pain, swelling, or warmth in the leg)  general ill feeling or flu-like symptoms  high potassium levels (chest pain; fast, irregular heartbeat; muscle weakness)  kidney injury (trouble passing urine or change in the amount of urine)  light-colored stool  liver injury (dark yellow or brown urine; general ill feeling or flu-like symptoms; loss of appetite, right upper belly pain; unusually weak or tired, yellowing of the eyes or skin)  low red blood cell counts (trouble breathing; feeling faint; lightheaded, falls; unusually weak or tired)  rash, fever, and swollen lymph nodes  redness, blistering, peeling, or loosening of the skin, including inside the mouth  stroke (changes in vision; confusion; trouble speaking or understanding; severe headaches; sudden numbness or weakness of the face, arm or leg; trouble walking; dizziness; loss of balance or coordination) Side effects that usually  do not require medical attention (report to your doctor or health care provider if they continue or are bothersome):  constipation  diarrhea  dizziness  gas  headache  heartburn  nausea, vomiting This list may not describe all possible side effects. Call your doctor for medical advice about side effects. You may report side effects to FDA at 1-800-FDA-1088. Where should I keep my medicine? Keep out of the reach of children and pets. Store at room temperature between 20 and 25 degrees C (68 and 77 degrees F). Keep this drug in the original container. Protect from moisture. Keep the container tightly closed. Get rid of  any unused medicine after the expiration date. To get rid of medicines that are no longer needed or have expired:  Take the medicine to a medicine take-back program. Check with your pharmacy or law enforcement to find a location.  If you cannot return the medicine, check the label or package insert to see if the medicine should be thrown out in the garbage or flushed down the toilet. If you are not sure, ask your health care provider. If it is safe to put it in the trash, empty the medicine out of the container. Mix the medicine with cat litter, dirt, coffee grounds, or other unwanted substance. Seal the mixture in a bag or container. Put it in the trash. NOTE: This sheet is a summary. It may not cover all possible information. If you have questions about this medicine, talk to your doctor, pharmacist, or health care provider.  2021 Elsevier/Gold Standard (2020-03-19 15:21:19)

## 2020-08-07 NOTE — Progress Notes (Signed)
Office Visit Note  Patient: Rickey Becker             Date of Birth: Oct 08, 1990           MRN: [id number]             PCP: Libby Maw, MD Referring: Libby Maw,* Visit Date: 08/07/2020   Subjective:  Follow-up (Patient has noticed no improvement in symptoms since taking colchicine. Patient complains of symptom flare within the last two weeks. )   History of Present Illness: Rickey Becker is a 30 y.o. male here for follow up for HIDS after starting colchicine 0.15m daily for 8 weeks. He has not noticed any benefit with this medicine. His flare up of skin lesions and joint pains still lasting as long and having symptoms every month. He is taking ibuprofen up to 800 mg TID or naproxen 400 mg BID but these are only partially beneficial. Work acommodations were not very great so has been splitting time with jobs.  Review of Systems  Constitutional: Positive for fatigue.  HENT: Negative for mouth sores, mouth dryness and nose dryness.   Eyes: Negative for pain, itching, visual disturbance and dryness.  Respiratory: Negative for cough, hemoptysis, shortness of breath and difficulty breathing.   Cardiovascular: Negative for chest pain, palpitations and swelling in legs/feet.  Gastrointestinal: Positive for diarrhea. Negative for abdominal pain, blood in stool and constipation.  Endocrine: Negative for increased urination.  Genitourinary: Negative for painful urination.  Musculoskeletal: Positive for arthralgias, joint pain, myalgias, muscle weakness, morning stiffness, muscle tenderness and myalgias. Negative for joint swelling.  Skin: Positive for rash and nodules/bumps. Negative for color change and redness.  Allergic/Immunologic: Positive for susceptible to infections.  Neurological: Positive for dizziness, numbness, headaches and weakness. Negative for memory loss.  Hematological: Positive for swollen glands.  Psychiatric/Behavioral: Negative for confusion and  sleep disturbance.    PMFS History:  Patient Active Problem List   Diagnosis Date Noted  . Low back pain 08/07/2020  . Moderate persistent asthma without complication 170/96/2836 . Pyuria 01/05/2020  . Healthcare maintenance 12/30/2019  . HIDS (hyperimmunoglobulinemia D with recurrent fever syndrome) ([facility] 05/15/2019    Past Medical History:  Diagnosis Date  . Asthma   . HIDS ([place]immunoglobulinemia D with recurrent fever syndrome) ([facility]    Per patient  . Juvenile arthritis ([facility]   . Periodic fever syndrome ([facility])     Family History  Problem Relation Age of Onset  . Hypertension Mother   . Cancer Father   . HIV Father    Past Surgical History:  Procedure Laterality Date  . EXTERNAL EAR SURGERY    . INNER EAR SURGERY     Social History   Social History Narrative  . Not on file   Immunization History  Administered Date(s) Administered  . PFIZER(Purple Top)SARS-COV-2 Vaccination 09/16/2019, 10/11/2019     Objective: Vital Signs: BP 126/73 (BP Location: Left Arm, Patient Position: Sitting, Cuff Size: Normal)   Pulse 94   Ht 6' (1.829 m)   Wt 242 lb 3.2 oz (109.9 kg)   BMI 32.85 kg/m    Physical Exam Eyes:     Conjunctiva/sclera: Conjunctivae normal.  Pulmonary:     Effort: Pulmonary effort is normal.  Skin:    General: Skin is warm and dry.     Findings: No rash.  Neurological:     Mental Status: He is alert.     Musculoskeletal Exam:  Wrists full ROM no tenderness  or swelling Fingers full ROM no tenderness or swelling Mild low back paraspinal muscle tenderness  Investigation: No additional findings.  Imaging: No results found.  Recent Labs: Lab Results  Component Value Date   WBC 5.7 01/03/2020   HGB 14.1 01/03/2020   PLT 205.0 01/03/2020   NA 139 08/07/2020   K 4.1 08/07/2020   CL 101 08/07/2020   CO2 31 08/07/2020   GLUCOSE 82 08/07/2020   BUN 10 08/07/2020   CREATININE 0.86 08/07/2020   BILITOT 0.6 08/07/2020   ALKPHOS 76  01/03/2020   AST 23 08/07/2020   ALT 29 08/07/2020   PROT 7.9 08/07/2020   ALBUMIN 4.0 01/03/2020   CALCIUM 9.1 08/07/2020   GFRAA 135 08/07/2020    Speciality Comments: No specialty comments available.  Procedures:  No procedures performed Allergies: Codeine   Assessment / Plan:     Visit Diagnoses: HIDS (hyperimmunoglobulinemia D with recurrent fever syndrome) ([place]) - Plan: meloxicam (MOBIC) 15 MG tablet, Sedimentation rate, COMPLETE METABOLIC PANEL WITH GFR  HIDS symptoms still occurring within every 1-2 months with flares lasting days to almost 2 weeks. No clinical response to maintenance colchicine, not much benefit with prednisone treatment during flares. Will check ESR today see if significantly changed from before taking any medicine. Recommend changing form PRN ibuprofen to trial of longer acting NSAID start meloxicam 15 mg PRN daily also checking CMP for monitoring after colchicine 8 weeks.  Chronic bilateral low back pain without sciatica - Plan: meloxicam (MOBIC) 15 MG tablet, Sedimentation rate, COMPLETE METABOLIC PANEL WITH GFR  Chronic back pain does not sound inflammatory in character, may also benefit with NSAID treatments.  Orders: Orders Placed This Encounter  Procedures  . Sedimentation rate  . COMPLETE METABOLIC PANEL WITH GFR   Meds ordered this encounter  Medications  . meloxicam (MOBIC) 15 MG tablet    Sig: Take 1 tablet (15 mg total) by mouth daily.    Dispense:  30 tablet    Refill:  2     Follow-Up Instructions: Return in about 3 months (around 11/06/2020) for HIDS on NSAIDs obs.   Collier Salina, MD  Note - This record has been created using [employer].  Chart creation errors have been sought, but may not always  have been located. Such creation errors do not reflect on  the standard of medical care.

## 2020-08-08 LAB — COMPLETE METABOLIC PANEL WITH GFR
AG Ratio: 1.1 (calc) (ref 1.0–2.5)
ALT: 29 U/L (ref 9–46)
AST: 23 U/L (ref 10–40)
Albumin: 4.1 g/dL (ref 3.6–5.1)
Alkaline phosphatase (APISO): 86 U/L (ref 36–130)
BUN: 10 mg/dL (ref 7–25)
CO2: 31 mmol/L (ref 20–32)
Calcium: 9.1 mg/dL (ref 8.6–10.3)
Chloride: 101 mmol/L (ref 98–110)
Creat: 0.86 mg/dL (ref 0.60–1.35)
GFR, Est African American: 135 mL/min/{1.73_m2} (ref >=60)
GFR, Est Non African American: 116 mL/min/{1.73_m2} (ref >=60)
Globulin: 3.8 g/dL (calc) — ABNORMAL HIGH (ref 1.9–3.7)
Glucose, Bld: 82 mg/dL (ref 65–99)
Potassium: 4.1 mmol/L (ref 3.5–5.3)
Sodium: 139 mmol/L (ref 135–146)
Total Bilirubin: 0.6 mg/dL (ref 0.2–1.2)
Total Protein: 7.9 g/dL (ref 6.1–8.1)

## 2020-08-08 LAB — SEDIMENTATION RATE: Sed Rate: 39 mm/h — ABNORMAL HIGH (ref 0–15)

## 2020-08-08 NOTE — Progress Notes (Signed)
Lab results show inflammatory markers still elevated after trial of colchicine so probably not having much improvement on disease activity, which matches up with the lack of symptoms response. No evidence of liver or kidney function problems.

## 2020-08-31 ENCOUNTER — Telehealth: Payer: Self-pay

## 2020-08-31 DIAGNOSIS — J040 Acute laryngitis: Secondary | ICD-10-CM | POA: Insufficient documentation

## 2020-08-31 DIAGNOSIS — M041 Periodic fever syndromes: Secondary | ICD-10-CM

## 2020-08-31 MED ORDER — PREDNISONE 20 MG PO TABS: ORAL_TABLET | ORAL | 0 refills | Status: AC

## 2020-08-31 MED ORDER — AMOXICILLIN-POT CLAVULANATE 875-125 MG PO TABS: 1.0000 | ORAL_TABLET | Freq: Two times a day (BID) | ORAL | 0 refills | Status: DC

## 2020-08-31 NOTE — Telephone Encounter (Signed)
FYI- I spoke with Rickey Becker he is feeling very poorly with fevers sores and laryngitis and productive cough since a few days ago.  This is mostly typical of his recurrent disease flares but sounds like a fairly severe episode.  I recommended another short course of oral steroids as well as an antibiotic for a possible sinusitis or upper respiratory infection based on symptoms.  Prescribed prednisone 20 mg 12 tablets and Augmentin 10 tablets recommended he let us know if symptoms fail to improve within several days of treatment but also to seek more urgent evaluation if it does a lot worse.

## 2020-08-31 NOTE — Telephone Encounter (Signed)
Patient called requesting prescription refill of Prednisone.  Patient states his symptoms include coughing up phlegm, fever, swollen lips, and laryngitis.  Patient states he still has a few pills remaining from his last prescription of Prednisone and requested a return call to let him know if he should take them.

## 2020-09-21 ENCOUNTER — Telehealth: Payer: Self-pay

## 2020-09-21 DIAGNOSIS — J040 Acute laryngitis: Secondary | ICD-10-CM

## 2020-09-21 DIAGNOSIS — M041 Periodic fever syndromes: Secondary | ICD-10-CM

## 2020-09-21 MED ORDER — AMOXICILLIN-POT CLAVULANATE 875-125 MG PO TABS: 1.0000 | ORAL_TABLET | Freq: Two times a day (BID) | ORAL | 0 refills | Status: DC

## 2020-09-21 NOTE — Telephone Encounter (Signed)
Patient called stating he finished his Amoxicillin prescription, but still has a cough.  Patient requested a refill be sent to [facility] at [facility].

## 2020-09-21 NOTE — Telephone Encounter (Signed)
Okay to refill this x1, sinus infection is okay to treat for up to 10 days. I would not recommend longer duration of treatment than this without additional/repeat evaluation for it. Cough can last a few weeks after clearance of bacterial infection due the inflammation and drainage.

## 2020-09-21 NOTE — Telephone Encounter (Signed)
I called patient, patient verbalized understanding. 

## 2020-10-27 ENCOUNTER — Other Ambulatory Visit: Payer: Self-pay | Admitting: Family Medicine

## 2020-10-27 DIAGNOSIS — J4540 Moderate persistent asthma, uncomplicated: Secondary | ICD-10-CM

## 2020-11-05 ENCOUNTER — Ambulatory Visit: Payer: Medicare HMO | Admitting: Internal Medicine

## 2021-09-03 DIAGNOSIS — E559 Vitamin D deficiency, unspecified: Secondary | ICD-10-CM | POA: Diagnosis not present

## 2021-09-03 DIAGNOSIS — E669 Obesity, unspecified: Secondary | ICD-10-CM | POA: Diagnosis not present

## 2021-09-03 DIAGNOSIS — R7303 Prediabetes: Secondary | ICD-10-CM | POA: Diagnosis not present

## 2021-09-03 DIAGNOSIS — J45909 Unspecified asthma, uncomplicated: Secondary | ICD-10-CM | POA: Diagnosis not present

## 2021-09-03 DIAGNOSIS — Z1159 Encounter for screening for other viral diseases: Secondary | ICD-10-CM | POA: Diagnosis not present

## 2021-09-03 DIAGNOSIS — Z79899 Other long term (current) drug therapy: Secondary | ICD-10-CM | POA: Diagnosis not present

## 2021-09-03 DIAGNOSIS — M041 Periodic fever syndromes: Secondary | ICD-10-CM | POA: Diagnosis not present

## 2021-09-03 DIAGNOSIS — Z0000 Encounter for general adult medical examination without abnormal findings: Secondary | ICD-10-CM | POA: Diagnosis not present

## 2021-09-03 DIAGNOSIS — K429 Umbilical hernia without obstruction or gangrene: Secondary | ICD-10-CM | POA: Diagnosis not present

## 2021-11-01 ENCOUNTER — Other Ambulatory Visit: Payer: Self-pay | Admitting: Nurse Practitioner

## 2021-11-01 ENCOUNTER — Ambulatory Visit
Admission: RE | Admit: 2021-11-01 | Discharge: 2021-11-01 | Disposition: A | Discharge status: Home / Self Care | Payer: Medicare HMO | Source: Ambulatory Visit | Attending: Nurse Practitioner | Admitting: Nurse Practitioner

## 2021-11-01 DIAGNOSIS — M461 Sacroiliitis, not elsewhere classified: Secondary | ICD-10-CM

## 2021-11-05 NOTE — Progress Notes (Signed)
Sent message, via epic in basket, requesting order in epic from surgeon     11/05/21 0827  Preop Orders  Has preop orders? No  Name of staff/physician contacted for orders(Indicate phone or IB message) Stechschulte, Hyman Hopes, MD.

## 2021-11-06 ENCOUNTER — Ambulatory Visit: Payer: Self-pay | Admitting: Surgery

## 2021-11-07 NOTE — Patient Instructions (Addendum)
SURGICAL WAITING ROOM VISITATION Patients having surgery or a procedure may have no more than 2 support people in the waiting area - these visitors may rotate.   Children under the age of 84 must have an adult with them who is not the patient. If the patient needs to stay at the hospital during part of their recovery, the visitor guidelines for inpatient rooms apply. Pre-op nurse will coordinate an appropriate time for 1 support person to accompany patient in pre-op.  This support person may not rotate.    Please refer to the [facility] website for the visitor guidelines for Inpatients (after your surgery is over and you are in a regular room).         Your procedure is scheduled on:  11-25-21   Report to [facility] Main Entrance    Report to admitting at 7:45 AM   Call this number if you have problems the morning of surgery [phone]   Do not eat food :After Midnight the night before surgery   After Midnight you may have the following liquids until 7:00 AM DAY OF SURGERY  Clear Liquid Diet Water Black Coffee (sugar ok, NO MILK/CREAM OR CREAMERS)  Tea (sugar ok, NO MILK/CREAM OR CREAMERS) regular and decaf                             Plain Jell-O (NO RED)                                           Fruit ices (not with fruit pulp, NO RED)                                     Popsicles (NO RED)                                                                  Juice: apple, WHITE grape, WHITE cranberry Sports drinks like Gatorade (NO RED)                       If you have questions, please contact your surgeon's office.   FOLLOW  ANY ADDITIONAL PRE OP INSTRUCTIONS YOU RECEIVED FROM YOUR SURGEON'S OFFICE!!!     Oral Hygiene is also important to reduce your risk of infection.                                    Remember - BRUSH YOUR TEETH THE MORNING OF SURGERY WITH YOUR REGULAR TOOTHPASTE   Do NOT smoke after Midnight   Take these medicines the morning of surgery with A  SIP OF WATER: Tylenol.  Okay to use Albuterol inhaler and Flonase nasal spray                              You may not have any metal on your body including  jewelry, and body piercing  Do not wear lotions, powders, cologne, or deodorant              Men may shave face and neck.    Contacts, dentures or bridgework may not be worn into surgery.  Do not bring valuables to the hospital. [place] IS NOT RESPONSIBLE   FOR VALUABLES.  DO NOT BRING YOUR HOME MEDICATIONS TO THE HOSPITAL. PHARMACY WILL DISPENSE MEDICATIONS LISTED ON YOUR MEDICATION LIST TO YOU DURING YOUR ADMISSION IN THE HOSPITAL!   Patients discharged on the day of surgery will not be allowed to drive home.  Someone NEEDS to stay with you for the first 24 hours after anesthesia.  Special Instructions: Bring a copy of your healthcare power of attorney and living will documents the day of surgery if you haven't scanned them before.  Please read over the following fact sheets you were given: IF YOU HAVE QUESTIONS ABOUT YOUR PRE-OP INSTRUCTIONS PLEASE CALL [phone] [facility] - Preparing for Surgery Before surgery, you can play an important role.  Because skin is not sterile, your skin needs to be as free of germs as possible.  You can reduce the number of germs on your skin by washing with CHG (chlorahexidine gluconate) soap before surgery.  CHG is an antiseptic cleaner which kills germs and bonds with the skin to continue killing germs even after washing. Please DO NOT use if you have an allergy to CHG or antibacterial soaps.  If your skin becomes reddened/irritated stop using the CHG and inform your nurse when you arrive at Short Stay. Do not shave (including legs and underarms) for at least 48 hours prior to the first CHG shower.  You may shave your face/neck.  Please follow these instructions carefully:  1.  Shower with CHG Soap the night before surgery and the  morning of surgery.  2.  If you choose to  wash your hair, wash your hair first as usual with your normal  shampoo.  3.  After you shampoo, rinse your hair and body thoroughly to remove the shampoo.                             4.  Use CHG as you would any other liquid soap.  You can apply chg directly to the skin and wash.  Gently with a scrungie or clean washcloth.  5.  Apply the CHG Soap to your body ONLY FROM THE NECK DOWN.   Do   not use on face/ open                           Wound or open sores. Avoid contact with eyes, ears mouth and   genitals (private parts).                       Wash face,  Genitals (private parts) with your normal soap.             6.  Wash thoroughly, paying special attention to the area where your    surgery  will be performed.  7.  Thoroughly rinse your body with warm water from the neck down.  8.  DO NOT shower/wash with your normal soap after using and rinsing off the CHG Soap.                9.  Pat yourself dry with a clean towel.  10.  Wear clean pajamas.            11.  Place clean sheets on your bed the night of your first shower and do not  sleep with pets. Day of Surgery : Do not apply any lotions/deodorants the morning of surgery.  Please wear clean clothes to the hospital/surgery center.  FAILURE TO FOLLOW THESE INSTRUCTIONS MAY RESULT IN THE CANCELLATION OF YOUR SURGERY  PATIENT SIGNATURE_________________________________  NURSE SIGNATURE__________________________________  ________________________________________________________________________

## 2021-11-07 NOTE — Progress Notes (Addendum)
COVID Vaccine Completed:  Yes  Date of COVID positive in last 90 days:  No  PCP - Debria Garret, MD Cardiologist - N/A Rheumatologist - Sheliah Hatch, MD  Chest x-ray - N/A EKG - N/A Stress Test - N/A ECHO - N/A Cardiac Cath - N/A Pacemaker/ICD device last checked: Spinal Cord Stimulator:N/A  Bowel Prep - N/A  Sleep Study - N/A CPAP -   Fasting Blood Sugar - N/A Checks Blood Sugar _____ times a day  Blood Thinner Instructions:N/A Aspirin Instructions: Last Dose:  Activity level:   Can go up a flight of stairs and perform activities of daily living without stopping and without symptoms of chest pain or shortness of breath.  Able to exercise without symptoms  Anesthesia review:   HIDS syndrome, asthma  Patient denies shortness of breath, fever, cough and chest pain at PAT appointment  Patient verbalized understanding of instructions that were given to them at the PAT appointment. Patient was also instructed that they will need to review over the PAT instructions again at home before surgery.

## 2021-11-12 ENCOUNTER — Other Ambulatory Visit: Payer: Self-pay

## 2021-11-12 ENCOUNTER — Encounter (HOSPITAL_COMMUNITY)
Admission: RE | Admit: 2021-11-12 | Discharge: 2021-11-12 | Disposition: A | Discharge status: Home / Self Care | Payer: Medicare HMO | Source: Ambulatory Visit | Attending: Surgery | Admitting: Surgery

## 2021-11-12 ENCOUNTER — Encounter (HOSPITAL_COMMUNITY): Payer: Self-pay

## 2021-11-12 DIAGNOSIS — Z01818 Encounter for other preprocedural examination: Secondary | ICD-10-CM | POA: Diagnosis not present

## 2021-11-24 NOTE — Anesthesia Preprocedure Evaluation (Addendum)
Anesthesia Evaluation  Patient identified by MRN, date of birth, ID band Patient awake    Reviewed: Allergy & Precautions, NPO status , Patient's Chart, lab work & pertinent test results  Airway Mallampati: II  TM Distance: >3 FB Neck ROM: Full    Dental no notable dental hx. (+) Teeth Intact, Dental Advisory Given   Pulmonary asthma , former smoker,    Pulmonary exam normal breath sounds clear to auscultation       Cardiovascular Exercise Tolerance: Good Normal cardiovascular exam Rhythm:Regular Rate:Normal     Neuro/Psych    GI/Hepatic   Endo/Other  negative endocrine ROS  Renal/GU negative Renal ROS     Musculoskeletal  (+) Arthritis , hyperimmunoglobulinemia D with recurrent fever syndrome   Abdominal   Peds  Hematology   Anesthesia Other Findings ALL: codeine  Reproductive/Obstetrics                            Anesthesia Physical Anesthesia Plan  ASA: 1  Anesthesia Plan: General   Post-op Pain Management: Precedex, Minimal or no pain anticipated and Toradol IV (intra-op)*   Induction: Intravenous  PONV Risk Score and Plan: 3 and Treatment may vary due to age or medical condition, Midazolam, Dexamethasone and Ondansetron  Airway Management Planned: Oral ETT  Additional Equipment: None  Intra-op Plan:   Post-operative Plan: Extubation in OR  Informed Consent: I have reviewed the patients History and Physical, chart, labs and discussed the procedure including the risks, benefits and alternatives for the proposed anesthesia with the patient or authorized representative who has indicated his/her understanding and acceptance.     Dental advisory given  Plan Discussed with:   Anesthesia Plan Comments:        Anesthesia Quick Evaluation

## 2021-11-25 ENCOUNTER — Other Ambulatory Visit: Payer: Self-pay

## 2021-11-25 ENCOUNTER — Ambulatory Visit (HOSPITAL_COMMUNITY)
Admission: RE | Admit: 2021-11-25 | Discharge: 2021-11-25 | Disposition: A | Discharge status: Home / Self Care | Payer: Medicare HMO | Source: Ambulatory Visit | Attending: Surgery | Admitting: Surgery

## 2021-11-25 ENCOUNTER — Ambulatory Visit (HOSPITAL_COMMUNITY): Payer: Medicare HMO | Admitting: Physician Assistant

## 2021-11-25 ENCOUNTER — Ambulatory Visit (HOSPITAL_BASED_OUTPATIENT_CLINIC_OR_DEPARTMENT_OTHER): Payer: Medicare HMO | Admitting: Anesthesiology

## 2021-11-25 ENCOUNTER — Encounter (HOSPITAL_COMMUNITY): Payer: Self-pay | Admitting: Surgery

## 2021-11-25 ENCOUNTER — Encounter (HOSPITAL_COMMUNITY)
Admission: RE | Disposition: A | Discharge status: Home / Self Care | Payer: Self-pay | Source: Ambulatory Visit | Attending: Surgery

## 2021-11-25 DIAGNOSIS — K429 Umbilical hernia without obstruction or gangrene: Secondary | ICD-10-CM | POA: Diagnosis present

## 2021-11-25 DIAGNOSIS — J45909 Unspecified asthma, uncomplicated: Secondary | ICD-10-CM | POA: Diagnosis not present

## 2021-11-25 DIAGNOSIS — M041 Periodic fever syndromes: Secondary | ICD-10-CM | POA: Diagnosis not present

## 2021-11-25 DIAGNOSIS — Z87891 Personal history of nicotine dependence: Secondary | ICD-10-CM | POA: Diagnosis not present

## 2021-11-25 HISTORY — PX: UMBILICAL HERNIA REPAIR: SHX196

## 2021-11-25 SURGERY — REPAIR, HERNIA, UMBILICAL, ADULT
Anesthesia: General | Site: Abdomen

## 2021-11-25 MED ORDER — KETOROLAC TROMETHAMINE 15 MG/ML IJ SOLN
15.0000 mg | INTRAMUSCULAR | Status: AC
  Administered 2021-11-25: 15 mg via INTRAVENOUS
  Filled 2021-11-25: qty 1

## 2021-11-25 MED ORDER — ROCURONIUM BROMIDE 100 MG/10ML IV SOLN
INTRAVENOUS | Status: DC | PRN
  Administered 2021-11-25: 100 mg via INTRAVENOUS

## 2021-11-25 MED ORDER — OXYCODONE-ACETAMINOPHEN 5-325 MG PO TABS: 1.0000 | ORAL_TABLET | ORAL | 0 refills | Status: AC | PRN

## 2021-11-25 MED ORDER — CEFAZOLIN SODIUM-DEXTROSE 2-4 GM/100ML-% IV SOLN
2.0000 g | INTRAVENOUS | Status: AC
  Administered 2021-11-25: 2 g via INTRAVENOUS
  Filled 2021-11-25: qty 100

## 2021-11-25 MED ORDER — BUPIVACAINE-EPINEPHRINE (PF) 0.25% -1:200000 IJ SOLN
INTRAMUSCULAR | Status: AC
  Filled 2021-11-25: qty 30

## 2021-11-25 MED ORDER — MIDAZOLAM HCL 5 MG/5ML IJ SOLN
INTRAMUSCULAR | Status: DC | PRN
  Administered 2021-11-25: 2 mg via INTRAVENOUS

## 2021-11-25 MED ORDER — ONDANSETRON HCL 4 MG/2ML IJ SOLN
INTRAMUSCULAR | Status: DC | PRN
  Administered 2021-11-25: 4 mg via INTRAVENOUS

## 2021-11-25 MED ORDER — PROPOFOL 10 MG/ML IV BOLUS
INTRAVENOUS | Status: DC | PRN
  Administered 2021-11-25: 150 mg via INTRAVENOUS

## 2021-11-25 MED ORDER — ONDANSETRON HCL 4 MG/2ML IJ SOLN: 4.0000 mg | Freq: Once | INTRAMUSCULAR | Status: DC | PRN

## 2021-11-25 MED ORDER — ORAL CARE MOUTH RINSE: 15.0000 mL | Freq: Once | OROMUCOSAL | Status: AC

## 2021-11-25 MED ORDER — MIDAZOLAM HCL 2 MG/2ML IJ SOLN
INTRAMUSCULAR | Status: AC
  Filled 2021-11-25: qty 2

## 2021-11-25 MED ORDER — OXYCODONE HCL 5 MG PO TABS
ORAL_TABLET | ORAL | Status: AC
  Administered 2021-11-25: 5 mg via ORAL
  Filled 2021-11-25: qty 1

## 2021-11-25 MED ORDER — GABAPENTIN 300 MG PO CAPS
300.0000 mg | ORAL_CAPSULE | ORAL | Status: AC
  Administered 2021-11-25: 300 mg via ORAL
  Filled 2021-11-25: qty 1

## 2021-11-25 MED ORDER — CHLORHEXIDINE GLUCONATE CLOTH 2 % EX PADS: 6.0000 | MEDICATED_PAD | Freq: Once | CUTANEOUS | Status: DC

## 2021-11-25 MED ORDER — ONDANSETRON HCL 4 MG/2ML IJ SOLN
INTRAMUSCULAR | Status: AC
  Filled 2021-11-25: qty 2

## 2021-11-25 MED ORDER — FENTANYL CITRATE (PF) 100 MCG/2ML IJ SOLN
INTRAMUSCULAR | Status: DC | PRN
  Administered 2021-11-25: 100 ug via INTRAVENOUS

## 2021-11-25 MED ORDER — ROCURONIUM BROMIDE 10 MG/ML (PF) SYRINGE
PREFILLED_SYRINGE | INTRAVENOUS | Status: AC
  Filled 2021-11-25: qty 10

## 2021-11-25 MED ORDER — DEXMEDETOMIDINE HCL IN NACL 80 MCG/20ML IV SOLN
INTRAVENOUS | Status: AC
  Filled 2021-11-25: qty 20

## 2021-11-25 MED ORDER — LIDOCAINE HCL (PF) 2 % IJ SOLN
INTRAMUSCULAR | Status: AC
  Filled 2021-11-25: qty 5

## 2021-11-25 MED ORDER — PHENYLEPHRINE 80 MCG/ML (10ML) SYRINGE FOR IV PUSH (FOR BLOOD PRESSURE SUPPORT)
PREFILLED_SYRINGE | INTRAVENOUS | Status: AC
  Filled 2021-11-25: qty 10

## 2021-11-25 MED ORDER — OXYCODONE HCL 5 MG/5ML PO SOLN: 5.0000 mg | Freq: Once | ORAL | Status: AC | PRN

## 2021-11-25 MED ORDER — ACETAMINOPHEN 500 MG PO TABS
1000.0000 mg | ORAL_TABLET | ORAL | Status: AC
  Administered 2021-11-25: 1000 mg via ORAL
  Filled 2021-11-25: qty 2

## 2021-11-25 MED ORDER — OXYCODONE HCL 5 MG PO TABS: 5.0000 mg | ORAL_TABLET | Freq: Once | ORAL | Status: AC | PRN

## 2021-11-25 MED ORDER — DEXAMETHASONE SODIUM PHOSPHATE 10 MG/ML IJ SOLN
INTRAMUSCULAR | Status: DC | PRN
  Administered 2021-11-25: 10 mg via INTRAVENOUS

## 2021-11-25 MED ORDER — SUGAMMADEX SODIUM 500 MG/5ML IV SOLN
INTRAVENOUS | Status: DC | PRN
  Administered 2021-11-25: 231.6 mg via INTRAVENOUS

## 2021-11-25 MED ORDER — LIDOCAINE HCL (CARDIAC) PF 100 MG/5ML IV SOSY
PREFILLED_SYRINGE | INTRAVENOUS | Status: DC | PRN
  Administered 2021-11-25: 100 mg via INTRAVENOUS

## 2021-11-25 MED ORDER — FENTANYL CITRATE (PF) 100 MCG/2ML IJ SOLN
INTRAMUSCULAR | Status: AC
  Filled 2021-11-25: qty 2

## 2021-11-25 MED ORDER — 0.9 % SODIUM CHLORIDE (POUR BTL) OPTIME
TOPICAL | Status: DC | PRN
  Administered 2021-11-25: 1000 mL

## 2021-11-25 MED ORDER — SUGAMMADEX SODIUM 500 MG/5ML IV SOLN
INTRAVENOUS | Status: AC
  Filled 2021-11-25: qty 5

## 2021-11-25 MED ORDER — HYDROMORPHONE HCL 1 MG/ML IJ SOLN
INTRAMUSCULAR | Status: AC
  Administered 2021-11-25: 0.5 mg via INTRAVENOUS
  Filled 2021-11-25: qty 2

## 2021-11-25 MED ORDER — KETOROLAC TROMETHAMINE 30 MG/ML IJ SOLN: 30.0000 mg | Freq: Once | INTRAMUSCULAR | Status: AC | PRN

## 2021-11-25 MED ORDER — DEXAMETHASONE SODIUM PHOSPHATE 10 MG/ML IJ SOLN
INTRAMUSCULAR | Status: AC
  Filled 2021-11-25: qty 1

## 2021-11-25 MED ORDER — LACTATED RINGERS IV SOLN: INTRAVENOUS | Status: DC

## 2021-11-25 MED ORDER — BUPIVACAINE-EPINEPHRINE 0.25% -1:200000 IJ SOLN
INTRAMUSCULAR | Status: DC | PRN
  Administered 2021-11-25: 30 mL

## 2021-11-25 MED ORDER — PROPOFOL 10 MG/ML IV BOLUS
INTRAVENOUS | Status: AC
  Filled 2021-11-25: qty 20

## 2021-11-25 MED ORDER — KETOROLAC TROMETHAMINE 30 MG/ML IJ SOLN
INTRAMUSCULAR | Status: AC
  Administered 2021-11-25: 30 mg via INTRAVENOUS
  Filled 2021-11-25: qty 1

## 2021-11-25 MED ORDER — CHLORHEXIDINE GLUCONATE 0.12 % MT SOLN
15.0000 mL | Freq: Once | OROMUCOSAL | Status: AC
  Administered 2021-11-25: 15 mL via OROMUCOSAL

## 2021-11-25 MED ORDER — GABAPENTIN 100 MG PO CAPS: 100.0000 mg | ORAL_CAPSULE | Freq: Three times a day (TID) | ORAL | 2 refills | Status: AC

## 2021-11-25 MED ORDER — HYDROMORPHONE HCL 1 MG/ML IJ SOLN
0.2500 mg | INTRAMUSCULAR | Status: DC | PRN
  Administered 2021-11-25: 0.5 mg via INTRAVENOUS

## 2021-11-25 MED ORDER — DEXMEDETOMIDINE (PRECEDEX) IN NS 20 MCG/5ML (4 MCG/ML) IV SYRINGE
PREFILLED_SYRINGE | INTRAVENOUS | Status: DC | PRN
  Administered 2021-11-25: 12 ug via INTRAVENOUS

## 2021-11-25 SURGICAL SUPPLY — 24 items
COTTONBALL LRG STERILE PKG (GAUZE/BANDAGES/DRESSINGS) ×2 IMPLANT
COVER SURGICAL LIGHT HANDLE (MISCELLANEOUS) ×2 IMPLANT
DERMABOND ADVANCED (GAUZE/BANDAGES/DRESSINGS) ×1
DERMABOND ADVANCED .7 DNX12 (GAUZE/BANDAGES/DRESSINGS) ×1 IMPLANT
DRAPE LAPAROTOMY T 98X78 PEDS (DRAPES) ×2 IMPLANT
DRSG TEGADERM 4X4.75 (GAUZE/BANDAGES/DRESSINGS) ×2 IMPLANT
ELECT REM PT RETURN 15FT ADLT (MISCELLANEOUS) ×2 IMPLANT
GLOVE BIOGEL PI IND STRL 8 (GLOVE) ×1 IMPLANT
GLOVE BIOGEL PI INDICATOR 8 (GLOVE) ×1
GOWN STRL REUS W/ TWL XL LVL3 (GOWN DISPOSABLE) ×1 IMPLANT
GOWN STRL REUS W/TWL XL LVL3 (GOWN DISPOSABLE) ×2
KIT BASIN OR (CUSTOM PROCEDURE TRAY) ×2 IMPLANT
KIT TURNOVER KIT A (KITS) IMPLANT
NEEDLE HYPO 22GX1.5 SAFETY (NEEDLE) ×2 IMPLANT
NS IRRIG 1000ML POUR BTL (IV SOLUTION) ×2 IMPLANT
PACK GENERAL/GYN (CUSTOM PROCEDURE TRAY) ×2 IMPLANT
SPIKE FLUID TRANSFER (MISCELLANEOUS) ×2 IMPLANT
SUT MNCRL AB 4-0 PS2 18 (SUTURE) ×2 IMPLANT
SUT NOVA NAB GS-21 0 18 T12 DT (SUTURE) IMPLANT
SUT PDS AB 2-0 CT2 27 (SUTURE) IMPLANT
SUT VIC AB 3-0 SH 8-18 (SUTURE) ×2 IMPLANT
SYR CONTROL 10ML LL (SYRINGE) ×2 IMPLANT
TOWEL OR 17X26 10 PK STRL BLUE (TOWEL DISPOSABLE) ×2 IMPLANT
TRAY FOLEY MTR SLVR 16FR STAT (SET/KITS/TRAYS/PACK) IMPLANT

## 2021-11-25 NOTE — Discharge Instructions (Signed)
 VENTRAL HERNIA REPAIR POST OPERATIVE INSTRUCTIONS  Thinking Clearly  The anesthesia may cause you to feel different for 1 or 2 days. Do not drive, drink alcohol, or make any big decisions for at least 2 days.  Nutrition When you wake up, you will be able to drink small amounts of liquid. If you do not feel sick, you can slowly advance your diet to regular foods. Continue to drink lots of fluids, usually about 8 to 10 glasses per day. Eat a high-fiber diet so you don't strain during bowel movements. High-Fiber Foods Foods high in fiber include beans, bran cereals and whole-grain breads, peas, dried fruit (figs, apricots, and dates), raspberries, blackberries, strawberries, sweet corn, broccoli, baked potatoes with skin, plums, pears, apples, greens, and nuts. Activity Slowly increase your activity. Be sure to get up and walk every hour or so to prevent blood clots. No heavy lifting or strenuous activity for 4 weeks following surgery to prevent hernias at your incision sites or recurrence of your hernia. It is normal to feel tired. You may need more sleep than usual.  Get your rest but make sure to get up and move around frequently to prevent blood clots and pneumonia.  Work and Return to School You can go back to work when you feel well enough. Discuss the timing with your surgeon. You can usually go back to school or work 1 week or less after an laparoscopic or an open repair. If your work requires heavy lifting or strenuous activity you need to be placed on light duty for 4 weeks following surgery. You can return to gym class, sports or other physical activities 4 weeks after surgery.  Wound Care You may experience significant bruising throughout the abdominal wall that may track down into the groin including into the scrotum in males.  Rest, elevating the groin and scrotum above the level of the heart, ice and compression with tight fitting underwear or an abdominal binder can help.   Always wash your hands before and after touching near your incision site. Do not soak in a bathtub until cleared at your follow up appointment. You may take a shower 24 hours after surgery. A small amount of drainage from the incision is normal. If the drainage is thick and yellow or the site is red, you may have an infection, so call your surgeon. If you have a drain in one of your incisions, it will be taken out in office when the drainage stops. Steri-Strips will fall off in 7 to 10 days or they will be removed during your first office visit. If you have dermabond glue covering over the incision, allow the glue to flake off on its own. Protect the new skin, especially from the sun. The sun can burn and cause darker scarring. Your scar will heal in about 4 to 6 weeks and will become softer and continue to fade over the next year.  The cosmetic appearance of the incisions will improve over the course of the first year after surgery. Sensation around your incision will return in a few weeks or months.  Bowel Movements After intestinal surgery, you may have loose watery stools for several days. If watery diarrhea lasts longer than 3 days, contact your surgeon. Pain medication (narcotics) can cause constipation. Increase the fiber in your diet with high-fiber foods if you are constipated. You can take an over the counter stool softener like Colace to avoid constipation.  Additional over the counter medications can also be used   if Colace isn't sufficient (for example, Milk of Magnesia or Miralax).  Pain The amount of pain is different for each person. Some people need only 1 to 3 doses of pain control medication, while others need more. Take alternating doses of tylenol and ibuprofen around the clock for the first five days following surgery.  This will provide a baseline of pain control and help with inflammation.  Take the narcotic pain medication in addition if needed for severe pain.  Contact  Your Surgeon at [phone], if you have: Pain that will not go away Pain that gets worse A fever of more than 101F (38.3C) Repeated vomiting Swelling, redness, bleeding, or bad-smelling drainage from your wound site Strong abdominal pain No bowel movement or unable to pass gas for 3 days Watery diarrhea lasting longer than 3 days  Pain Control The goal of pain control is to minimize pain, keep you moving and help you heal. Your surgical team will work with you on your pain plan. Most often a combination of therapies and medications are used to control your pain. You may also be given medication (local anesthetic) at the surgical site. This may help control your pain for several days. Extreme pain puts extra stress on your body at a time when your body needs to focus on healing. Do not wait until your pain has reached a level "10" or is unbearable before telling your doctor or nurse. It is much easier to control pain before it becomes severe. Following a laparoscopic procedure, pain is sometimes felt in the shoulder. This is due to the gas inserted into your abdomen during the procedure. Moving and walking helps to decrease the gas and the right shoulder pain.  Use the guide below for ways to manage your post-operative pain. Learn more by going to facs.org/safepaincontrol.  How Intense Is My Pain Common Therapies to Feel Better       I hardly notice my pain, and it does not interfere with my activities.  I notice my pain and it distracts me, but I can still do activities (sitting up, walking, standing).  Non-Medication Therapies  Ice (in a bag, applied over clothing at the surgical site), elevation, rest, meditation, massage, distraction (music, TV, play) walking and mild exercise Splinting the abdomen with pillows +  Non-Opioid Medications Acetaminophen (Tylenol) Non-steroidal anti-inflammatory drugs (NSAIDS) Aspirin, Ibuprofen (Motrin, Advil) Naproxen (Aleve) Take these as  needed, when you feel pain. Both acetaminophen and NSAIDs help to decrease pain and swelling (inflammation).      My pain is hard to ignore and is more noticeable even when I rest.  My pain interferes with my usual activities.  Non-Medication Therapies  +  Non-Opioid medications  Take on a regular schedule (around-the-clock) instead of as needed. (For example, Tylenol every 6 hours at 9:00 am, 3:00 pm, 9:00 pm, 3:00 am and Motrin every 6 hours at 12:00 am, 6:00 am, 12:00 pm, 6:00 pm)         I am focused on my pain, and I am not doing my daily activities.  I am groaning in pain, and I cannot sleep. I am unable to do anything.  My pain is as bad as it could be, and nothing else matters.  Non-Medication Therapies  +  Around-the-Clock Non-Opioid Medications  +  Short-acting opioids  Opioids should be used with other medications to manage severe pain. Opioids block pain and give a feeling of euphoria (feel high). Addiction, a serious side effect of opioids, is   rare with short-term (a few days) use.  Examples of short-acting opioids include: Tramadol (Ultram), Hydrocodone (Norco, Vicodin), Hydromorphone (Dilaudid), Oxycodone (Oxycontin)     The above directions have been adapted from the American College of Surgeons Surgical Patient Education Program.  Please refer to the ACS website if needed: https://[email or web address].org/-/media/files/education/patient-ed/adultumbilical.ashx   Vick Filter, MD [facility] [address], [address], [place], [place]  [address] ?  [address] [address], [place]   [address] [phone] ? [phone] ? FAX [phone] Web site: [email or web address]  

## 2021-11-25 NOTE — Op Note (Signed)
   Patient: Rickey Becker (24-Dec-1990, [id number])  Date of Surgery: 11/25/2021   Preoperative Diagnosis: UMBILICAL HERNIA   Postoperative Diagnosis: UMBILICAL HERNIA   Surgical Procedure: PRIMARY UMBILICAL HERNIA REPAIR:    Operative Team Members:  Surgeon(s) and Role:    * Sydnie Sigmund, Hyman Hopes, MD - Primary   Anesthesiologist: Trevor Iha, MD CRNA: [place], Stephanie C, CRNA; Epimenio Sarin, CRNA   Anesthesia: General   Fluids:  Total I/O In: -  Out: 10 [Blood:10]  Complications: None  Drains:  none   Specimen: None  Disposition:  PACU - hemodynamically stable.  Plan of Care: Discharge to home after PACU    Indications for Procedure: Rickey Becker is a 31 y.o. male who presented with an umbilical hernia.  I recommended umbilical hernia repair, possibly with mesh. The procedure, its risks, benefits and alternatives were discussed and the patient granted consent to proceed.  Findings:  Hernia Location: Umbililcal Hernia Size:  1 x 1 cm  Mesh Size &Type:  None Mesh Position: N/A  Description of Procedure:  The patient was positioned supine, padded and secured to the bed.  The abdomen was widely prepped and draped.  A time out procedure was performed.    A curvilinear incision was made above the umbilicus and dissection was carried down through the subcutaneous tissue to the level of the fascia.  The umbilical stalk was encircled and the hernia sac amputated off the umbilical skin.  The hernia defect was measured as 1 cm wide by 1 cm in vertical dimension.     A sutured umbilical hernia repair was performed utilizing figure of eight 0 novafil suture. The defect was closed horizontally under no tension.  The wound was irrigated with saline.  The umbilical skin was tacked down to the fascial repair with 4-0 Monocryl suture.  The skin was closed with 4-0 Monocryl subcuticular suture and skin glue.    Ivar Drape, MD General, Bariatric, & Minimally  Invasive Surgery [facility] [place]

## 2021-11-25 NOTE — H&P (Signed)
Admitting Physician: Hyman Hopes Yenty Bloch  Service: General surgery  CC: hernia  Subjective   HPI: Rickey Becker is an 31 y.o. male who is here for hernia repair  Past Medical History:  Diagnosis Date   Asthma    HIDS (hyperimmunoglobulinemia D with recurrent fever syndrome) (HCC)    Per patient   Juvenile arthritis (HCC)    Periodic fever syndrome (HCC)     Past Surgical History:  Procedure Laterality Date   EXTERNAL EAR SURGERY     INNER EAR SURGERY     WISDOM TOOTH EXTRACTION      Family History  Problem Relation Age of Onset   Hypertension Mother    Cancer Father    HIV Father     Social:  reports that he has quit smoking. His smoking use included cigarettes. He has a 0.50 pack-year smoking history. He has never used smokeless tobacco. He reports that he does not currently use alcohol. He reports that he does not use drugs.  Allergies:  Allergies  Allergen Reactions   Codeine Hives    Medications: Current Outpatient Medications  Medication Instructions   acetaminophen (TYLENOL) 1,000 mg, Oral, Every 6 hours PRN   albuterol (VENTOLIN HFA) 108 (90 Base) MCG/ACT inhaler TAKE 2 PUFFS BY MOUTH EVERY 6 HOURS AS NEEDED FOR WHEEZE OR SHORTNESS OF BREATH   cyclobenzaprine (FLEXERIL) 10 mg, Oral, 3 times daily   diclofenac (VOLTAREN) 75 mg, Oral, 2 times daily   ELDERBERRY PO 1 tablet, Oral, Daily   fluticasone (FLONASE) 50 MCG/ACT nasal spray 2 sprays, Each Nare, Daily PRN   meloxicam (MOBIC) 15 mg, Oral, Daily   Multiple Vitamin (MULTI-VITAMIN DAILY PO) 1 tablet, Oral, Daily   Vitamin D (Ergocalciferol) (DRISDOL) 50,000 Units, Oral, Weekly    ROS - all of the below systems have been reviewed with the patient and positives are indicated with bold text General: chills, fever or night sweats Eyes: blurry vision or double vision ENT: epistaxis or sore throat Allergy/Immunology: itchy/watery eyes or nasal congestion Hematologic/Lymphatic: bleeding problems,  blood clots or swollen lymph nodes Endocrine: temperature intolerance or unexpected weight changes Breast: new or changing breast lumps or nipple discharge Resp: cough, shortness of breath, or wheezing CV: chest pain or dyspnea on exertion GI: as per HPI GU: dysuria, trouble voiding, or hematuria MSK: joint pain or joint stiffness Neuro: TIA or stroke symptoms Derm: pruritus and skin lesion changes Psych: anxiety and depression  Objective   PE Blood pressure (!) 129/90, pulse 69, temperature 98 F (36.7 C), temperature source Oral, resp. rate 14, height 6' (1.829 m), weight 115.8 kg, SpO2 96 %. Constitutional: NAD; conversant; no deformities Eyes: Moist conjunctiva; no lid lag; anicteric; PERRL Neck: Trachea midline; no thyromegaly Lungs: Normal respiratory effort; no tactile fremitus CV: RRR; no palpable thrills; no pitting edema GI: Abd -palpable bulge at the bellybutton, unable to be reduced with pressure standing or laying flat. Area is tender.; no palpable hepatosplenomegaly MSK: Normal range of motion of extremities; no clubbing/cyanosis Psychiatric: Appropriate affect; alert and oriented x3 Lymphatic: No palpable cervical or axillary lymphadenopathy  No results found for this or any previous visit (from the past 24 hour(s)).  Imaging Orders  No imaging studies ordered today     Assessment and Plan  Umbilical hernia without obstruction and without gangrene  HIDS (hyperimmunoglobulinemia D with recurrent fever syndrome) (CMS-HCC)   Rickey Becker has a umbilical hernia. I recommended umbilical hernia repair with mesh. The procedure itself as well as  its risk, benefits, and alternatives were discussed the patient in full. After full discussion all questions answered patient granted consent to proceed. Patient has HIDS. I am not familiar with this disease, but he has a rheumatologist that helps with management. It appears surgery may exacerbate this syndrome. I would like to  contact the rheumatologist for a preoperative evaluation and advice for any special care that may be needed perioperatively.    Quentin Ore, MD  [facility] Use [email or web address] to contact on call provider

## 2021-11-25 NOTE — Anesthesia Postprocedure Evaluation (Signed)
Anesthesia Post Note  Patient: Rickey Becker  Procedure(s) Performed: PRIMARY UMBILICAL HERNIA REPAIR (Abdomen)     Patient location during evaluation: PACU Anesthesia Type: General Level of consciousness: awake and alert Pain management: pain level controlled Vital Signs Assessment: post-procedure vital signs reviewed and stable Respiratory status: spontaneous breathing, nonlabored ventilation, respiratory function stable and patient connected to nasal cannula oxygen Cardiovascular status: blood pressure returned to baseline and stable Postop Assessment: no apparent nausea or vomiting Anesthetic complications: no   No notable events documented.  Last Vitals:  Vitals:   11/25/21 1200 11/25/21 1215  BP: 111/69 138/86  Pulse: 71 74  Resp: 14 15  Temp:    SpO2: 93% 94%    Last Pain:  Vitals:   11/25/21 1225  TempSrc:   PainSc: 5                  Trevor Iha

## 2021-11-25 NOTE — Transfer of Care (Signed)
Immediate Anesthesia Transfer of Care Note  Patient: Rickey Becker  Procedure(s) Performed: PRIMARY UMBILICAL HERNIA REPAIR (Abdomen)  Patient Location: PACU  Anesthesia Type:General  Level of Consciousness: awake, alert  and oriented  Airway & Oxygen Therapy: Patient Spontanous Breathing and Patient connected to face mask oxygen  Post-op Assessment: Report given to RN and Post -op Vital signs reviewed and stable  Post vital signs: Reviewed and stable  Last Vitals:  Vitals Value Taken Time  BP 134/74 11/25/21 1135  Temp 36.7 C 11/25/21 1135  Pulse 77 11/25/21 1136  Resp 15 11/25/21 1136  SpO2 100 % 11/25/21 1136  Vitals shown include unvalidated device data.  Last Pain:  Vitals:   11/25/21 0800  TempSrc:   PainSc: 0-No pain      Patients Stated Pain Goal: 3 (11/25/21 0800)  Complications: No notable events documented.

## 2021-11-25 NOTE — Anesthesia Procedure Notes (Signed)
Procedure Name: Intubation Date/Time: 11/25/2021 10:33 AM  Performed by: [place] Manus Rudd, CRNAPre-anesthesia Checklist: Patient identified, Emergency Drugs available, Suction available and Patient being monitored Patient Re-evaluated:Patient Re-evaluated prior to induction Oxygen Delivery Method: Circle system utilized Preoxygenation: Pre-oxygenation with 100% oxygen Induction Type: IV induction Ventilation: Mask ventilation without difficulty Laryngoscope Size: Mac and 4 Grade View: Grade I Tube type: Oral Tube size: 7.5 mm Number of attempts: 1 Airway Equipment and Method: Stylet and Oral airway Placement Confirmation: ETT inserted through vocal cords under direct vision, positive ETCO2 and breath sounds checked- equal and bilateral Secured at: 22 cm Tube secured with: Tape Dental Injury: Teeth and Oropharynx as per pre-operative assessment

## 2021-11-26 ENCOUNTER — Encounter (HOSPITAL_COMMUNITY): Payer: Self-pay | Admitting: Surgery

## 2023-01-30 ENCOUNTER — Encounter (HOSPITAL_COMMUNITY): Payer: Self-pay

## 2023-01-30 ENCOUNTER — Emergency Department (HOSPITAL_COMMUNITY): Payer: Medicare HMO

## 2023-01-30 ENCOUNTER — Emergency Department (HOSPITAL_COMMUNITY)
Admission: EM | Admit: 2023-01-30 | Discharge: 2023-01-30 | Disposition: A | Discharge status: Home / Self Care | Payer: Medicare HMO

## 2023-01-30 ENCOUNTER — Other Ambulatory Visit: Payer: Self-pay

## 2023-01-30 DIAGNOSIS — R600 Localized edema: Secondary | ICD-10-CM | POA: Diagnosis present

## 2023-01-30 DIAGNOSIS — R220 Localized swelling, mass and lump, head: Secondary | ICD-10-CM

## 2023-01-30 LAB — CBC
HCT: 43 % (ref 39.0–52.0)
Hemoglobin: 13.6 g/dL (ref 13.0–17.0)
MCH: 26 pg (ref 26.0–34.0)
MCHC: 31.6 g/dL (ref 30.0–36.0)
MCV: 82.1 fL (ref 80.0–100.0)
Platelets: 258 10*3/uL (ref 150–400)
RBC: 5.24 MIL/uL (ref 4.22–5.81)
RDW: 16.3 % — ABNORMAL HIGH (ref 11.5–15.5)
WBC: 6.5 10*3/uL (ref 4.0–10.5)
nRBC: 0 % (ref 0.0–0.2)

## 2023-01-30 LAB — BASIC METABOLIC PANEL
Anion gap: 9 (ref 5–15)
BUN: 11 mg/dL (ref 6–20)
CO2: 26 mmol/L (ref 22–32)
Calcium: 8.6 mg/dL — ABNORMAL LOW (ref 8.9–10.3)
Chloride: 101 mmol/L (ref 98–111)
Creatinine, Ser: 0.9 mg/dL (ref 0.61–1.24)
GFR, Estimated: >60 mL/min (ref >60)
Glucose, Bld: 81 mg/dL (ref 70–99)
Potassium: 3.7 mmol/L (ref 3.5–5.1)
Sodium: 136 mmol/L (ref 135–145)

## 2023-01-30 MED ORDER — IOHEXOL 300 MG/ML  SOLN
75.0000 mL | Freq: Once | INTRAMUSCULAR | Status: AC | PRN
  Administered 2023-01-30: 75 mL via INTRAVENOUS

## 2023-01-30 NOTE — ED Triage Notes (Addendum)
Pt c/o right side ear pain, jaw pain and swelling for about 3 days. Was seen by UC, but has not started taking prescribed abx yet. Pt took tylenol and 800mg  ibuprofen around 1000

## 2023-01-30 NOTE — Discharge Instructions (Signed)
Return here for any trouble swallowing.  Take your antibiotics as directed

## 2023-01-30 NOTE — ED Provider Notes (Signed)
Patient signed out to me by Dr. Maple Hudson pending results of CT soft tissue neck.  CT scan reviewed and shows large lymph node likely reactive.  No ptosis.  Planes are normal.  Patient has antibiotics which she will start taking and return precautions given   Lorre Nick, MD 01/30/23 1831

## 2023-01-30 NOTE — ED Provider Notes (Signed)
[place] [facility] AT [facility] Provider Note   CSN: [id number] Arrival date & time: 01/30/23  1224     History  Chief Complaint  Patient presents with   Facial Swelling    STEIN WINDHORST is a 32 y.o. male.  32 year old male presented emergency department by referral with his PCP with right-sided facial swelling x 3 days with pain.  Notes muffled sounds in his ear.  Noticed something similar several weeks ago was given doxycycline with improvement.  He notes pain to the jaw, but no dental pain.  No painful difficulty swallowing.  No difficulty breathing.        Home Medications Prior to Admission medications   Medication Sig Start Date End Date Taking? Authorizing Provider  acetaminophen (TYLENOL) 500 MG tablet Take 1,000 mg by mouth every 6 (six) hours as needed for mild pain or headache.     [provider]  albuterol (VENTOLIN HFA) 108 (90 Base) MCG/ACT inhaler TAKE 2 PUFFS BY MOUTH EVERY 6 HOURS AS NEEDED FOR WHEEZE OR SHORTNESS OF BREATH 03/19/20   Mliss Sax, MD  cyclobenzaprine (FLEXERIL) 10 MG tablet Take 10 mg by mouth 3 (three) times daily. 10/31/21   [provider]  diclofenac (VOLTAREN) 75 MG EC tablet Take 75 mg by mouth 2 (two) times daily. 10/31/21   [provider]  ELDERBERRY PO Take 1 tablet by mouth daily.    [provider]  fluticasone (FLONASE) 50 MCG/ACT nasal spray Place 2 sprays into both nostrils daily as needed for allergies or rhinitis.    [provider]  gabapentin (NEURONTIN) 100 MG capsule Take 1 capsule (100 mg total) by mouth 3 (three) times daily. 11/25/21 11/25/22  Stechschulte, Hyman Hopes, MD  meloxicam (MOBIC) 15 MG tablet Take 1 tablet (15 mg total) by mouth daily. 08/07/20   Fuller Plan, MD  Multiple Vitamin (MULTI-VITAMIN DAILY PO) Take 1 tablet by mouth daily.    [provider]  Vitamin D, Ergocalciferol, (DRISDOL) 1.25 MG (50000 UNIT) CAPS capsule  Take 50,000 Units by mouth once a week. 09/19/21   [provider]      Allergies    Codeine    Review of Systems   Review of Systems  Physical Exam Updated Vital Signs BP 126/88 (BP Location: Left Arm)   Pulse 77   Temp 97.8 F (36.6 C) (Oral)   Resp 18   SpO2 99%  Physical Exam Vitals and nursing note reviewed.  Constitutional:      General: He is not in acute distress.    Appearance: He is not toxic-appearing.  HENT:     Head: Normocephalic.     Comments: Patient does have some swelling to the preauricular area along jaw.  Floor mouth is soft.  No angioedema.  Uvula is midline.    Nose: Nose normal.     Mouth/Throat:     Mouth: Mucous membranes are moist.  Eyes:     Conjunctiva/sclera: Conjunctivae normal.  Cardiovascular:     Rate and Rhythm: Normal rate and regular rhythm.  Pulmonary:     Effort: Pulmonary effort is normal.  Abdominal:     General: Abdomen is flat. There is no distension.  Skin:    General: Skin is warm and dry.     Capillary Refill: Capillary refill takes less than 2 seconds.  Neurological:     Mental Status: He is alert and oriented to person, place, and time.  Psychiatric:  Mood and Affect: Mood normal.        Behavior: Behavior normal.     ED Results / Procedures / Treatments   Labs (all labs ordered are listed, but only abnormal results are displayed) Labs Reviewed  CBC - Abnormal; Notable for the following components:      Result Value   RDW 16.3 (*)    All other components within normal limits  BASIC METABOLIC PANEL - Abnormal; Notable for the following components:   Calcium 8.6 (*)    All other components within normal limits    EKG None  Radiology No results found.  Procedures Procedures    Medications Ordered in ED Medications  iohexol (OMNIPAQUE) 300 MG/ML solution 75 mL (75 mLs Intravenous Contrast Given 01/30/23 1710)    ED Course/ Medical Decision Making/ A&P                                  Medical Decision Making 32 year old male presenting emergency department with right facial swelling and pain.  He is afebrile vital signs reassuring.  Appears to be handling his secretions well with no difficulty swallowing or breathing or respiratory involvement.  Basic labs without leukocytosis to suggest stomach infection.  Normal kidney function.  Will get CT neck to further evaluate patient's swelling.  Patient signed out to Dr. Freida Busman; disposition pending CT scan.  Amount and/or Complexity of Data Reviewed External Data Reviewed:     Details: Per chart review does have a history of HIDS Labs: ordered. Decision-making details documented in ED Course. Radiology: ordered. Decision-making details documented in ED Course.  Risk Prescription drug management.           Final Clinical Impression(s) / ED Diagnoses Final diagnoses:  None    Rx / DC Orders ED Discharge Orders     None         Coral Spikes, DO 01/30/23 1728

## 2024-04-04 ENCOUNTER — Other Ambulatory Visit: Payer: Self-pay | Admitting: Nurse Practitioner

## 2024-04-04 DIAGNOSIS — R319 Hematuria, unspecified: Secondary | ICD-10-CM

## 2024-04-04 DIAGNOSIS — M041 Periodic fever syndromes: Secondary | ICD-10-CM

## 2024-04-07 ENCOUNTER — Ambulatory Visit: Admission: RE | Admit: 2024-04-07 | Source: Ambulatory Visit

## 2024-04-07 DIAGNOSIS — M041 Periodic fever syndromes: Secondary | ICD-10-CM

## 2024-04-07 DIAGNOSIS — R319 Hematuria, unspecified: Secondary | ICD-10-CM
